# Patient Record
Sex: Male | Born: 1940 | Race: White | Hispanic: No | Marital: Married | Smoking: Never smoker
Health system: Southern US, Community
[De-identification: ages and names within clinical notes are randomized; demographics above are authoritative.]

## PROBLEM LIST (undated history)

## (undated) DIAGNOSIS — M1990 Unspecified osteoarthritis, unspecified site: Secondary | ICD-10-CM

## (undated) DIAGNOSIS — C61 Malignant neoplasm of prostate: Secondary | ICD-10-CM

## (undated) DIAGNOSIS — E785 Hyperlipidemia, unspecified: Secondary | ICD-10-CM

## (undated) DIAGNOSIS — R972 Elevated prostate specific antigen [PSA]: Secondary | ICD-10-CM

## (undated) DIAGNOSIS — I2510 Atherosclerotic heart disease of native coronary artery without angina pectoris: Secondary | ICD-10-CM

## (undated) DIAGNOSIS — IMO0002 Reserved for concepts with insufficient information to code with codable children: Secondary | ICD-10-CM

## (undated) DIAGNOSIS — M109 Gout, unspecified: Secondary | ICD-10-CM

## (undated) DIAGNOSIS — D126 Benign neoplasm of colon, unspecified: Secondary | ICD-10-CM

## (undated) DIAGNOSIS — N182 Chronic kidney disease, stage 2 (mild): Secondary | ICD-10-CM

## (undated) DIAGNOSIS — K4090 Unilateral inguinal hernia, without obstruction or gangrene, not specified as recurrent: Secondary | ICD-10-CM

## (undated) DIAGNOSIS — B029 Zoster without complications: Secondary | ICD-10-CM

## (undated) DIAGNOSIS — I10 Essential (primary) hypertension: Secondary | ICD-10-CM

## (undated) DIAGNOSIS — Z5189 Encounter for other specified aftercare: Secondary | ICD-10-CM

## (undated) DIAGNOSIS — R339 Retention of urine, unspecified: Secondary | ICD-10-CM

## (undated) DIAGNOSIS — N400 Enlarged prostate without lower urinary tract symptoms: Secondary | ICD-10-CM

## (undated) DIAGNOSIS — C44319 Basal cell carcinoma of skin of other parts of face: Secondary | ICD-10-CM

## (undated) DIAGNOSIS — H43813 Vitreous degeneration, bilateral: Secondary | ICD-10-CM

## (undated) HISTORY — PX: LUMBAR LAMINECTOMY: SHX95

## (undated) HISTORY — PX: HERNIA REPAIR: SHX51

## (undated) HISTORY — PX: SPINE SURGERY: SHX786

## (undated) HISTORY — DX: Atherosclerotic heart disease of native coronary artery without angina pectoris: I25.10

## (undated) HISTORY — DX: Hyperlipidemia, unspecified: E78.5

## (undated) HISTORY — PX: POLYPECTOMY: SHX149

## (undated) HISTORY — PX: OTHER SURGICAL HISTORY: SHX169

## (undated) HISTORY — DX: Benign neoplasm of colon, unspecified: D12.6

## (undated) HISTORY — PX: COLONOSCOPY: SHX174

## (undated) HISTORY — DX: Retention of urine, unspecified: R33.9

## (undated) HISTORY — DX: Elevated prostate specific antigen (PSA): R97.20

## (undated) HISTORY — DX: Chronic kidney disease, stage 2 (mild): N18.2

## (undated) HISTORY — DX: Essential (primary) hypertension: I10

## (undated) HISTORY — DX: Reserved for concepts with insufficient information to code with codable children: IMO0002

## (undated) HISTORY — PX: BIOPSY PROSTATE: PRO28

## (undated) HISTORY — PX: TONSILLECTOMY: SUR1361

## (undated) HISTORY — DX: Benign prostatic hyperplasia without lower urinary tract symptoms: N40.0

## (undated) HISTORY — DX: Zoster without complications: B02.9

## (undated) HISTORY — DX: Gout, unspecified: M10.9

## (undated) HISTORY — PX: CARDIOVASCULAR STRESS TEST: SHX262

## (undated) HISTORY — DX: Encounter for other specified aftercare: Z51.89

## (undated) HISTORY — PX: FLEXIBLE SIGMOIDOSCOPY: SHX1649

## (undated) HISTORY — DX: Unilateral inguinal hernia, without obstruction or gangrene, not specified as recurrent: K40.90

## (undated) HISTORY — DX: Malignant neoplasm of prostate: C61

## (undated) HISTORY — DX: Vitreous degeneration, bilateral: H43.813

## (undated) HISTORY — PX: HEMORRHOID SURGERY: SHX153

---

## 1979-11-04 DIAGNOSIS — IMO0002 Reserved for concepts with insufficient information to code with codable children: Secondary | ICD-10-CM

## 1979-11-04 HISTORY — DX: Reserved for concepts with insufficient information to code with codable children: IMO0002

## 1999-09-19 ENCOUNTER — Ambulatory Visit (HOSPITAL_BASED_OUTPATIENT_CLINIC_OR_DEPARTMENT_OTHER): Admission: RE | Admit: 1999-09-19 | Discharge: 1999-09-19 | Payer: Self-pay | Admitting: Surgery

## 1999-09-19 ENCOUNTER — Encounter (INDEPENDENT_AMBULATORY_CARE_PROVIDER_SITE_OTHER): Payer: Self-pay | Admitting: *Deleted

## 2000-02-17 ENCOUNTER — Ambulatory Visit (HOSPITAL_COMMUNITY): Admission: RE | Admit: 2000-02-17 | Discharge: 2000-02-17 | Payer: Self-pay | Admitting: Neurosurgery

## 2000-02-17 ENCOUNTER — Encounter: Payer: Self-pay | Admitting: Neurosurgery

## 2001-11-03 DIAGNOSIS — Z5189 Encounter for other specified aftercare: Secondary | ICD-10-CM

## 2001-11-03 DIAGNOSIS — IMO0001 Reserved for inherently not codable concepts without codable children: Secondary | ICD-10-CM

## 2001-11-03 DIAGNOSIS — I2510 Atherosclerotic heart disease of native coronary artery without angina pectoris: Secondary | ICD-10-CM

## 2001-11-03 HISTORY — PX: CORONARY ARTERY BYPASS GRAFT: SHX141

## 2001-11-03 HISTORY — DX: Atherosclerotic heart disease of native coronary artery without angina pectoris: I25.10

## 2001-11-03 HISTORY — DX: Reserved for inherently not codable concepts without codable children: IMO0001

## 2001-11-03 HISTORY — DX: Encounter for other specified aftercare: Z51.89

## 2001-11-10 ENCOUNTER — Inpatient Hospital Stay (HOSPITAL_COMMUNITY): Admission: EM | Admit: 2001-11-10 | Discharge: 2001-11-12 | Payer: Self-pay | Admitting: Emergency Medicine

## 2001-11-11 ENCOUNTER — Encounter: Payer: Self-pay | Admitting: *Deleted

## 2001-11-17 ENCOUNTER — Encounter: Payer: Self-pay | Admitting: Surgery

## 2001-11-17 ENCOUNTER — Inpatient Hospital Stay (HOSPITAL_COMMUNITY): Admission: RE | Admit: 2001-11-17 | Discharge: 2001-11-23 | Payer: Self-pay | Admitting: Surgery

## 2001-11-18 ENCOUNTER — Encounter: Payer: Self-pay | Admitting: Surgery

## 2001-11-19 ENCOUNTER — Encounter: Payer: Self-pay | Admitting: Surgery

## 2002-12-23 ENCOUNTER — Ambulatory Visit (HOSPITAL_BASED_OUTPATIENT_CLINIC_OR_DEPARTMENT_OTHER): Admission: RE | Admit: 2002-12-23 | Discharge: 2002-12-23 | Payer: Self-pay | Admitting: Surgery

## 2003-11-04 DIAGNOSIS — D126 Benign neoplasm of colon, unspecified: Secondary | ICD-10-CM

## 2003-11-04 HISTORY — DX: Benign neoplasm of colon, unspecified: D12.6

## 2005-02-11 ENCOUNTER — Ambulatory Visit: Payer: Self-pay | Admitting: *Deleted

## 2005-02-27 ENCOUNTER — Ambulatory Visit: Payer: Self-pay | Admitting: *Deleted

## 2005-04-07 ENCOUNTER — Ambulatory Visit: Payer: Self-pay | Admitting: Internal Medicine

## 2005-07-08 ENCOUNTER — Ambulatory Visit: Payer: Self-pay | Admitting: *Deleted

## 2005-07-10 ENCOUNTER — Ambulatory Visit: Payer: Self-pay

## 2005-07-15 ENCOUNTER — Ambulatory Visit: Payer: Self-pay | Admitting: *Deleted

## 2005-08-27 ENCOUNTER — Ambulatory Visit: Payer: Self-pay | Admitting: Internal Medicine

## 2006-03-03 ENCOUNTER — Ambulatory Visit: Payer: Self-pay | Admitting: *Deleted

## 2006-03-16 ENCOUNTER — Ambulatory Visit: Payer: Self-pay | Admitting: *Deleted

## 2006-06-30 ENCOUNTER — Ambulatory Visit: Payer: Self-pay | Admitting: *Deleted

## 2006-07-09 ENCOUNTER — Ambulatory Visit: Payer: Self-pay | Admitting: *Deleted

## 2006-08-31 ENCOUNTER — Ambulatory Visit: Payer: Self-pay | Admitting: Internal Medicine

## 2006-09-15 ENCOUNTER — Ambulatory Visit: Payer: Self-pay | Admitting: Internal Medicine

## 2006-11-26 ENCOUNTER — Ambulatory Visit: Payer: Self-pay | Admitting: Internal Medicine

## 2006-11-26 ENCOUNTER — Ambulatory Visit: Payer: Self-pay | Admitting: Gastroenterology

## 2006-11-26 LAB — CONVERTED CEMR LAB
BUN: 20 mg/dL (ref 6–23)
Creatinine, Ser: 1 mg/dL (ref 0.4–1.5)
Potassium: 4.8 meq/L (ref 3.5–5.1)

## 2006-11-30 ENCOUNTER — Ambulatory Visit: Payer: Self-pay | Admitting: Internal Medicine

## 2006-12-24 ENCOUNTER — Ambulatory Visit: Payer: Self-pay | Admitting: Internal Medicine

## 2006-12-28 ENCOUNTER — Ambulatory Visit: Payer: Self-pay | Admitting: Gastroenterology

## 2007-01-08 ENCOUNTER — Ambulatory Visit: Payer: Self-pay | Admitting: *Deleted

## 2007-02-23 ENCOUNTER — Ambulatory Visit: Payer: Self-pay | Admitting: Internal Medicine

## 2007-04-28 ENCOUNTER — Ambulatory Visit: Payer: Self-pay | Admitting: Internal Medicine

## 2007-06-04 ENCOUNTER — Ambulatory Visit: Payer: Self-pay | Admitting: Cardiovascular Disease

## 2007-06-04 ENCOUNTER — Ambulatory Visit: Payer: Self-pay

## 2007-06-04 ENCOUNTER — Encounter: Payer: Self-pay | Admitting: Internal Medicine

## 2007-06-04 LAB — CONVERTED CEMR LAB
ALT: 24 units/L (ref 0–53)
AST: 22 units/L (ref 0–37)
Albumin: 4 g/dL (ref 3.5–5.2)
Alkaline Phosphatase: 49 units/L (ref 39–117)
BUN: 22 mg/dL (ref 6–23)
Bilirubin, Direct: 0.1 mg/dL (ref 0.0–0.3)
CO2: 30 meq/L (ref 19–32)
Calcium: 9.2 mg/dL (ref 8.4–10.5)
Chloride: 108 meq/L (ref 96–112)
Cholesterol: 96 mg/dL (ref 0–200)
Creatinine, Ser: 1.1 mg/dL (ref 0.4–1.5)
GFR calc Af Amer: 86 mL/min
GFR calc non Af Amer: 71 mL/min
Glucose, Bld: 108 mg/dL — ABNORMAL HIGH (ref 70–99)
HDL: 29.9 mg/dL — ABNORMAL LOW (ref >39.0)
LDL Cholesterol: 42 mg/dL (ref 0–99)
Potassium: 4.3 meq/L (ref 3.5–5.1)
Sodium: 144 meq/L (ref 135–145)
Total Bilirubin: 0.9 mg/dL (ref 0.3–1.2)
Total CHOL/HDL Ratio: 3.2
Total Protein: 6.5 g/dL (ref 6.0–8.3)
Triglycerides: 119 mg/dL (ref 0–149)
VLDL: 24 mg/dL (ref 0–40)

## 2007-06-10 ENCOUNTER — Ambulatory Visit: Payer: Self-pay | Admitting: Cardiovascular Disease

## 2007-06-15 DIAGNOSIS — K219 Gastro-esophageal reflux disease without esophagitis: Secondary | ICD-10-CM

## 2007-06-15 DIAGNOSIS — K279 Peptic ulcer, site unspecified, unspecified as acute or chronic, without hemorrhage or perforation: Secondary | ICD-10-CM | POA: Insufficient documentation

## 2007-06-15 DIAGNOSIS — K649 Unspecified hemorrhoids: Secondary | ICD-10-CM | POA: Insufficient documentation

## 2007-06-15 DIAGNOSIS — B351 Tinea unguium: Secondary | ICD-10-CM

## 2007-06-17 ENCOUNTER — Ambulatory Visit: Payer: Self-pay | Admitting: Internal Medicine

## 2007-07-08 IMAGING — CT CT CHEST W/ CM
2 of 4 series · 15 of 36 positions shown, 18 images · IV contrast (omnipaque)
Comparison: None.

CLINICAL DATA: Chronic cough.  Shortness of breath.  Coronary artery disease and coronary bypass grafting. 
 CHEST CT WITH CONTRAST:
TECHNIQUE: Multidetector CT imaging of the chest was performed following the standard protocol during bolus administration of intravenous contrast.
 Contrast:  80 cc Omnipaque 300

[Series 2: chest_routine 5.0 b40f st · axial · 0.75mm/px · z∈[-370,-80]mm · 12 of 70 slices shown, 15 images]
[im 6/70  mediastinal]
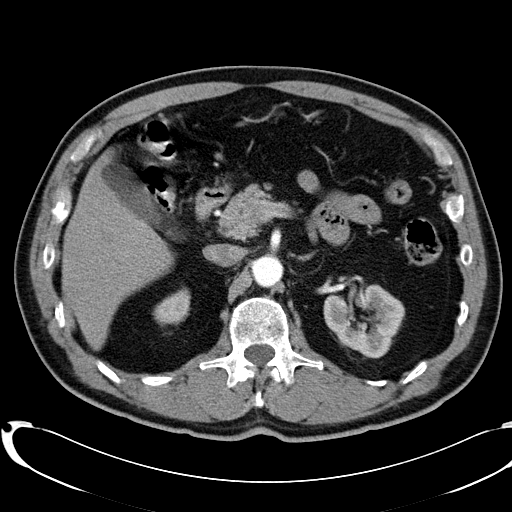
[im 6/70  lung]
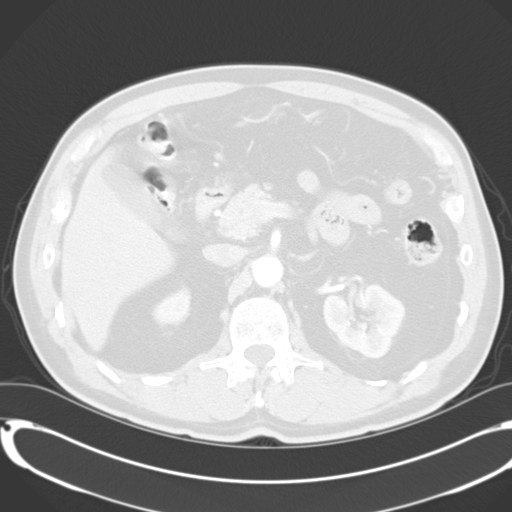
[im 11/70  lung]
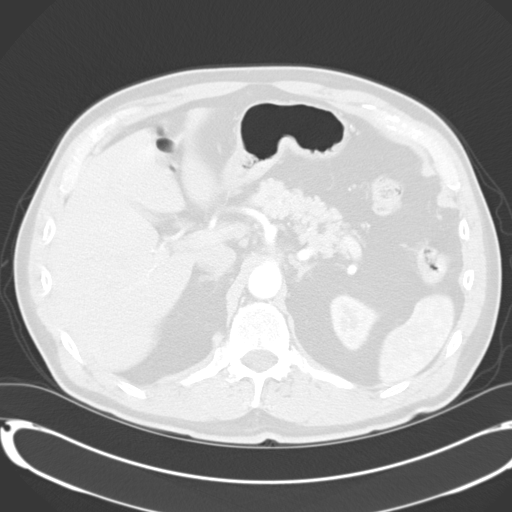
[im 16/70  lung]
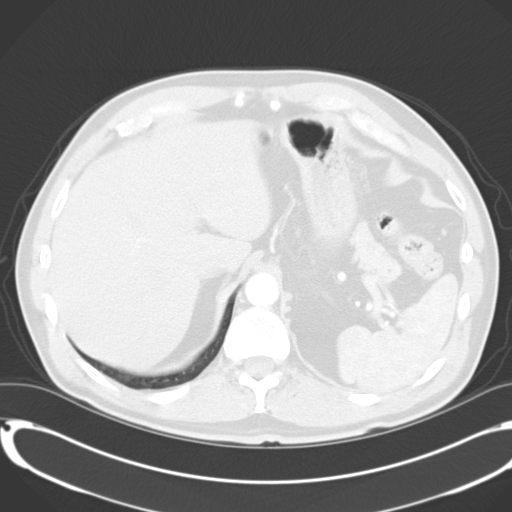
[im 22/70  lung]
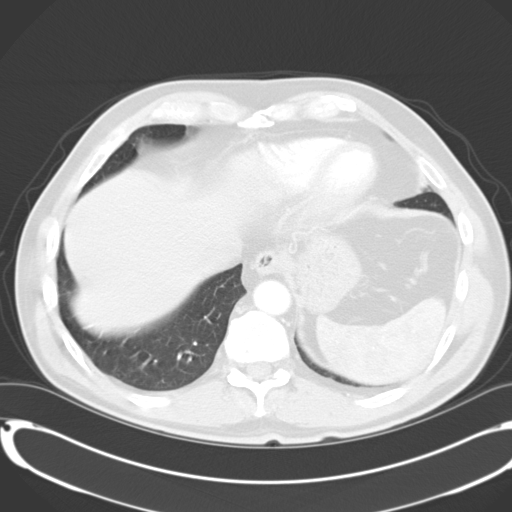
[im 27/70  mediastinal]
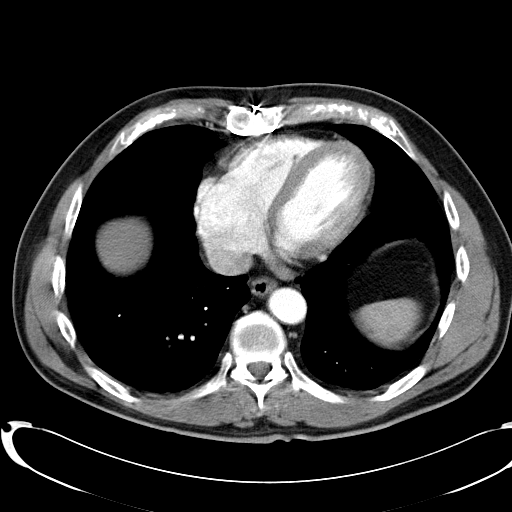
[im 27/70  lung]
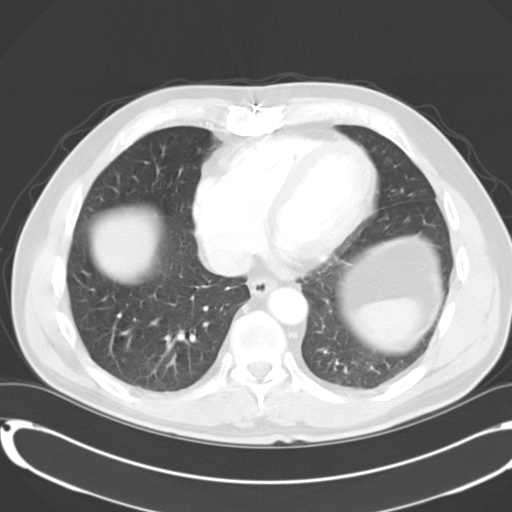
[im 32/70  lung]
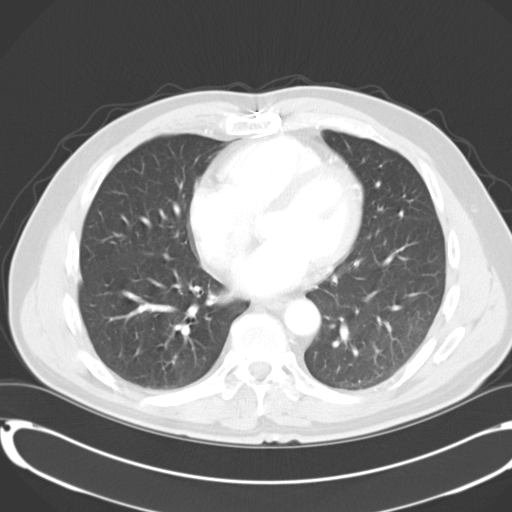
[im 38/70  lung]
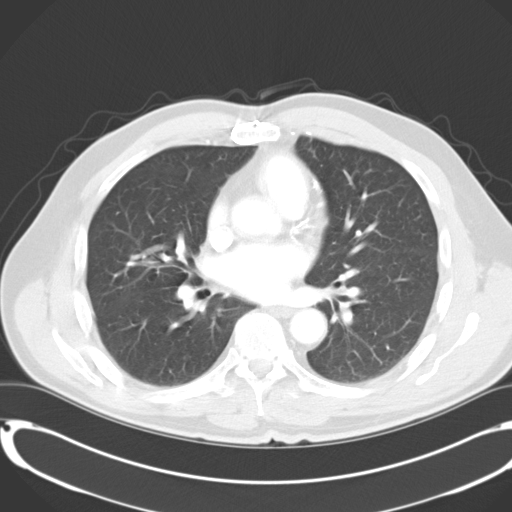
[im 43/70  lung]
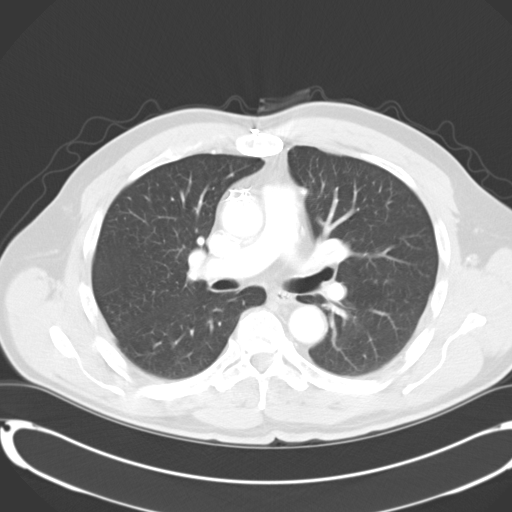
[im 48/70  mediastinal]
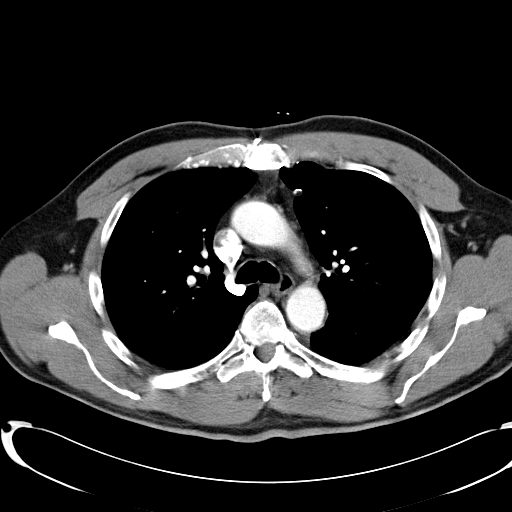
[im 48/70  lung]
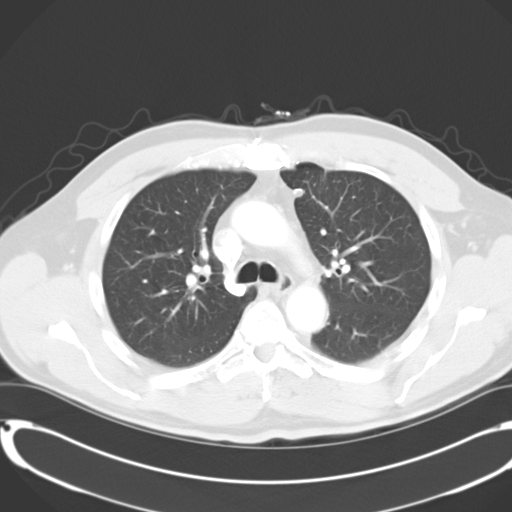
[im 54/70  lung]
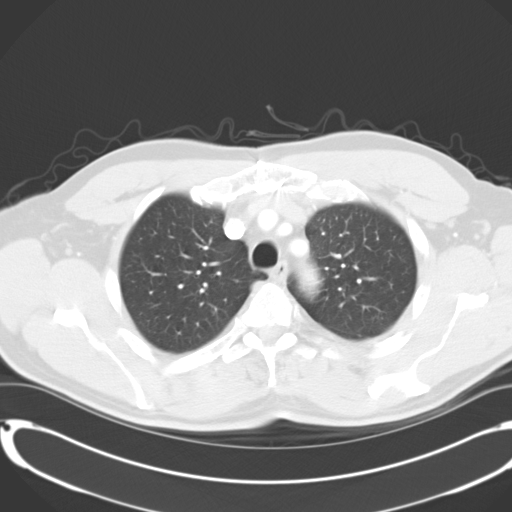
[im 59/70  lung]
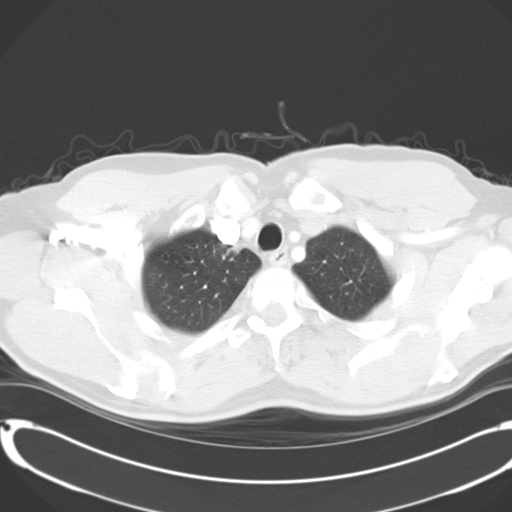
[im 64/70  lung]
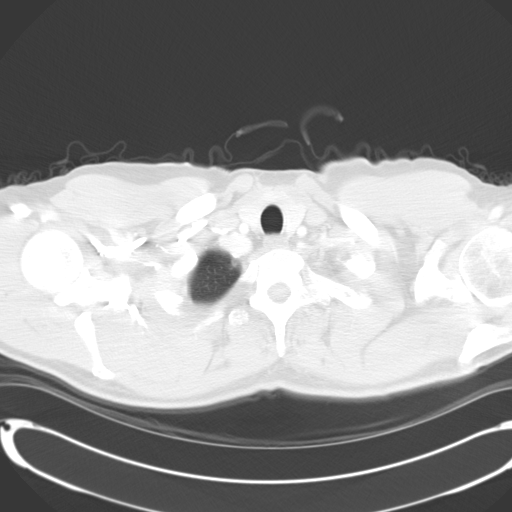

[Series 602: <mpr range> · coronal · 0.75mm/px · 3 of 92 slices shown]
[im 19/92  lung]
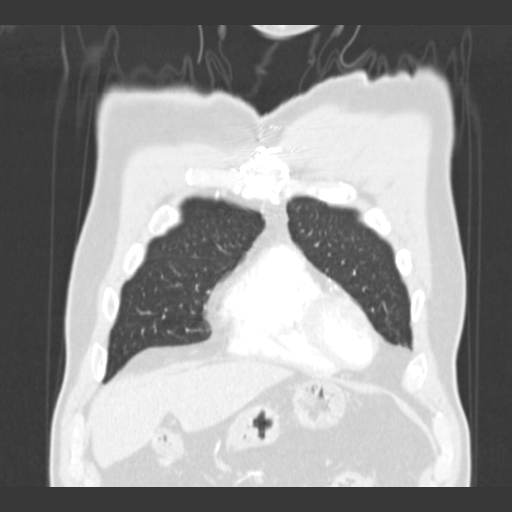
[im 37/92  lung]
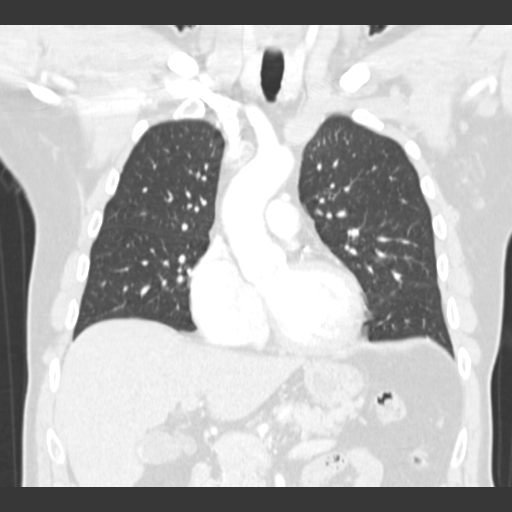
[im 55/92  lung]
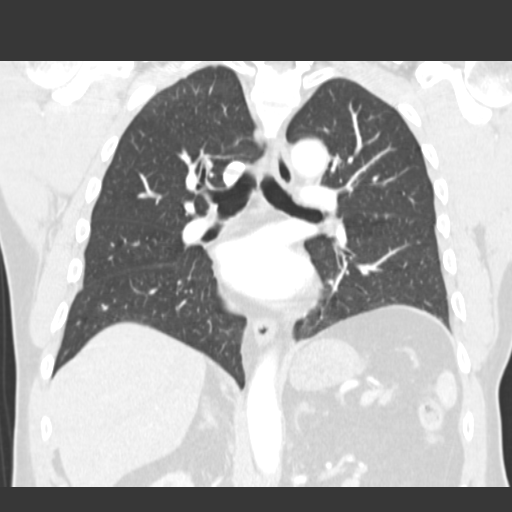

[15 of 36 positions shown; findings below may reference images not displayed]

FINDINGS: Postop changes are seen from prior coronary artery bypass grafting.  There is no evidence of mass or adenopathy within the thorax.  There is no evidence of pleural or pericardial effusion.  There is no evidence of pulmonary infiltrate.  Several tiny nodular densities are seen in both lungs which are indeterminate but are likely benign in patient without history of malignancies.  
 Images obtained through the upper abdomen show the presence of multiple small calcified gallstones.
IMPRESSION: 1.  No acute findings. 
 2.  Tiny indeterminate bilateral pulmonary nodules.  These are likely benign in patient without history of malignancy.  Clinical correlation is recommended, and followup CT is suggested in six months.  
 3.  Cholelithiasis incidentally noted.

## 2007-08-09 ENCOUNTER — Encounter: Payer: Self-pay | Admitting: Internal Medicine

## 2007-08-17 ENCOUNTER — Ambulatory Visit: Payer: Self-pay | Admitting: Internal Medicine

## 2007-08-30 ENCOUNTER — Ambulatory Visit: Payer: Self-pay | Admitting: Internal Medicine

## 2007-08-30 DIAGNOSIS — E782 Mixed hyperlipidemia: Secondary | ICD-10-CM | POA: Insufficient documentation

## 2007-08-30 DIAGNOSIS — N400 Enlarged prostate without lower urinary tract symptoms: Secondary | ICD-10-CM | POA: Insufficient documentation

## 2007-08-30 DIAGNOSIS — I10 Essential (primary) hypertension: Secondary | ICD-10-CM

## 2007-08-30 LAB — CONVERTED CEMR LAB
Bilirubin Urine: NEGATIVE
Blood in Urine, dipstick: NEGATIVE
Glucose, Urine, Semiquant: NEGATIVE
Ketones, urine, test strip: NEGATIVE
Nitrite: NEGATIVE
Protein, U semiquant: NEGATIVE
Specific Gravity, Urine: 1.025
Urobilinogen, UA: NEGATIVE
WBC Urine, dipstick: NEGATIVE
pH: 5

## 2007-12-31 ENCOUNTER — Ambulatory Visit: Payer: Self-pay | Admitting: Internal Medicine

## 2008-03-02 ENCOUNTER — Encounter: Payer: Self-pay | Admitting: Internal Medicine

## 2008-03-22 ENCOUNTER — Telehealth: Payer: Self-pay | Admitting: Internal Medicine

## 2008-03-22 ENCOUNTER — Encounter: Payer: Self-pay | Admitting: Internal Medicine

## 2008-03-22 ENCOUNTER — Telehealth (INDEPENDENT_AMBULATORY_CARE_PROVIDER_SITE_OTHER): Payer: Self-pay | Admitting: *Deleted

## 2008-06-09 ENCOUNTER — Ambulatory Visit: Payer: Self-pay | Admitting: Cardiovascular Disease

## 2008-06-09 LAB — CONVERTED CEMR LAB
ALT: 19 units/L (ref 0–53)
AST: 20 units/L (ref 0–37)
Albumin: 3.9 g/dL (ref 3.5–5.2)
Alkaline Phosphatase: 46 units/L (ref 39–117)
Bilirubin, Direct: 0.1 mg/dL (ref 0.0–0.3)
Cholesterol: 91 mg/dL (ref 0–200)
Glucose, Bld: 110 mg/dL — ABNORMAL HIGH (ref 70–99)
HDL: 29.4 mg/dL — ABNORMAL LOW (ref >39.0)
LDL Cholesterol: 48 mg/dL (ref 0–99)
Total Bilirubin: 0.8 mg/dL (ref 0.3–1.2)
Total CHOL/HDL Ratio: 3.1
Total Protein: 6.5 g/dL (ref 6.0–8.3)
Triglycerides: 66 mg/dL (ref 0–149)
VLDL: 13 mg/dL (ref 0–40)

## 2008-06-16 ENCOUNTER — Ambulatory Visit: Payer: Self-pay | Admitting: Cardiovascular Disease

## 2008-07-27 ENCOUNTER — Encounter: Payer: Self-pay | Admitting: Internal Medicine

## 2008-08-03 ENCOUNTER — Ambulatory Visit: Payer: Self-pay | Admitting: Internal Medicine

## 2008-08-16 ENCOUNTER — Encounter: Payer: Self-pay | Admitting: Internal Medicine

## 2008-08-28 ENCOUNTER — Ambulatory Visit: Payer: Self-pay | Admitting: Internal Medicine

## 2008-08-28 LAB — CONVERTED CEMR LAB
Bilirubin Urine: NEGATIVE
Ketones, urine, test strip: NEGATIVE
Specific Gravity, Urine: 1.025
pH: 6

## 2008-08-31 ENCOUNTER — Telehealth (INDEPENDENT_AMBULATORY_CARE_PROVIDER_SITE_OTHER): Payer: Self-pay | Admitting: *Deleted

## 2009-05-03 LAB — CONVERTED CEMR LAB: PSA: 2.65 ng/mL

## 2009-06-14 ENCOUNTER — Telehealth (INDEPENDENT_AMBULATORY_CARE_PROVIDER_SITE_OTHER): Payer: Self-pay | Admitting: *Deleted

## 2009-06-15 DIAGNOSIS — I25810 Atherosclerosis of coronary artery bypass graft(s) without angina pectoris: Secondary | ICD-10-CM | POA: Insufficient documentation

## 2009-06-18 ENCOUNTER — Ambulatory Visit: Payer: Self-pay

## 2009-06-18 ENCOUNTER — Encounter: Payer: Self-pay | Admitting: Cardiology

## 2009-06-18 ENCOUNTER — Ambulatory Visit: Payer: Self-pay | Admitting: Cardiovascular Disease

## 2009-06-21 ENCOUNTER — Ambulatory Visit: Payer: Self-pay | Admitting: Cardiovascular Disease

## 2009-06-22 ENCOUNTER — Ambulatory Visit: Payer: Self-pay

## 2009-06-22 ENCOUNTER — Encounter: Payer: Self-pay | Admitting: Cardiovascular Disease

## 2009-06-24 LAB — CONVERTED CEMR LAB
BUN: 23 mg/dL (ref 6–23)
Bilirubin, Direct: 0.1 mg/dL (ref 0.0–0.3)
Chloride: 106 meq/L (ref 96–112)
Glucose, Bld: 91 mg/dL (ref 70–99)
Potassium: 4.2 meq/L (ref 3.5–5.1)
Total Bilirubin: 1 mg/dL (ref 0.3–1.2)

## 2009-06-25 LAB — CONVERTED CEMR LAB
LDL Cholesterol: 49 mg/dL (ref 0–99)
VLDL: 20.4 mg/dL (ref 0.0–40.0)

## 2009-08-01 ENCOUNTER — Encounter: Payer: Self-pay | Admitting: Internal Medicine

## 2009-08-02 ENCOUNTER — Ambulatory Visit: Payer: Self-pay | Admitting: Internal Medicine

## 2009-09-03 ENCOUNTER — Ambulatory Visit: Payer: Self-pay | Admitting: Internal Medicine

## 2009-09-03 LAB — CONVERTED CEMR LAB
Glucose, Urine, Semiquant: NEGATIVE
Nitrite: NEGATIVE
Protein, U semiquant: NEGATIVE
Specific Gravity, Urine: 1.015
WBC Urine, dipstick: NEGATIVE
pH: 6

## 2009-11-20 ENCOUNTER — Encounter: Payer: Self-pay | Admitting: Internal Medicine

## 2009-11-24 ENCOUNTER — Ambulatory Visit: Payer: Self-pay | Admitting: Family Medicine

## 2009-12-24 ENCOUNTER — Ambulatory Visit: Payer: Self-pay | Admitting: Internal Medicine

## 2009-12-24 DIAGNOSIS — J0110 Acute frontal sinusitis, unspecified: Secondary | ICD-10-CM | POA: Insufficient documentation

## 2010-04-04 ENCOUNTER — Encounter: Payer: Self-pay | Admitting: Internal Medicine

## 2010-07-04 ENCOUNTER — Ambulatory Visit: Payer: Self-pay | Admitting: Cardiovascular Disease

## 2010-07-04 ENCOUNTER — Telehealth: Payer: Self-pay | Admitting: Cardiovascular Disease

## 2010-07-15 LAB — CONVERTED CEMR LAB
AST: 23 units/L (ref 0–37)
Albumin: 4.1 g/dL (ref 3.5–5.2)
Alkaline Phosphatase: 42 units/L (ref 39–117)
BUN: 22 mg/dL (ref 6–23)
Bilirubin, Direct: 0.2 mg/dL (ref 0.0–0.3)
CO2: 29 meq/L (ref 19–32)
Calcium: 9.2 mg/dL (ref 8.4–10.5)
Creatinine, Ser: 1.1 mg/dL (ref 0.4–1.5)
Total Bilirubin: 0.6 mg/dL (ref 0.3–1.2)
Total CHOL/HDL Ratio: 3
Triglycerides: 54 mg/dL (ref 0.0–149.0)

## 2010-07-17 ENCOUNTER — Ambulatory Visit: Payer: Self-pay | Admitting: Internal Medicine

## 2010-09-20 ENCOUNTER — Encounter: Payer: Self-pay | Admitting: Internal Medicine

## 2010-09-23 ENCOUNTER — Ambulatory Visit: Payer: Self-pay | Admitting: Internal Medicine

## 2010-09-23 LAB — CONVERTED CEMR LAB
Bilirubin Urine: NEGATIVE
Blood in Urine, dipstick: NEGATIVE
Ketones, urine, test strip: NEGATIVE
Nitrite: NEGATIVE
Protein, U semiquant: NEGATIVE
Urobilinogen, UA: 0.2

## 2010-11-26 ENCOUNTER — Telehealth: Payer: Self-pay | Admitting: Internal Medicine

## 2010-11-27 ENCOUNTER — Ambulatory Visit: Admit: 2010-11-27 | Payer: Self-pay | Admitting: Internal Medicine

## 2010-12-03 NOTE — Letter (Signed)
Summary: Medical Certificate Second Class/FAA  Medical Certificate Second Class/FAA   Imported By: Lanelle Bal 10/05/2010 11:56:19  _____________________________________________________________________  External Attachment:    Type:   Image     Comment:   External Document

## 2010-12-03 NOTE — Letter (Signed)
Summary: Letter Regarding Airman Medical Certificate/FAA  Letter Regarding [employer]man Medical Certificate/FAA   Imported By: Lanelle Bal 11/30/2009 12:49:32  _____________________________________________________________________  External Attachment:    Type:   Image     Comment:   External Document

## 2010-12-03 NOTE — Assessment & Plan Note (Signed)
Summary: cough//ccm   Vital Signs:  Patient profile:   70 year old male Temp:     99.8 degrees F oral Pulse rate:   76 / minute Resp:     14 per minute BP sitting:   130 / 80  (left arm)  Vitals Entered By: Willy Eddy, LPN (December 24, 2009 4:01 PM) CC: started 5 weeks ago when around mother in law who had penumonia- took zpack which helped- sick again to urgent care and was neg for flu but was given tamiflu and improved then got sick agian last week with cough and congestion and fever of 99.8-100.5   Primary Care Provider:  Stacie Glaze MD  CC:  started 5 weeks ago when around mother in law who had penumonia- took zpack which helped- sick again to urgent care and was neg for flu but was given tamiflu and improved then got sick agian last week with cough and congestion and fever of 99.8-100.5.  History of Present Illness: 5 weeks ago had a "URI" took a zpack from neighbor the pt was seen seveval weeks ago and was tested fro flu he tool tamiflu and got better then he stated to cough ( productive) and chills with low grade fevel last weekend into today extreme fatique  Preventive Screening-Counseling & Management  Alcohol-Tobacco     Smoking Status: never  Current Medications (verified): 1)  Adult Aspirin Low Strength 81 Mg  Tbdp (Aspirin) .... Once Daily 2)  Avodart 0.5 Mg  Caps (Dutasteride) .... Once Daily 3)  Crestor 10 Mg  Tabs (Rosuvastatin Calcium) .... Once Daily 4)  Omega-3 1000 Mg  Caps (Omega-3 Fatty Acids) .... Once Daily 5)  Atenolol 25 Mg  Tabs (Atenolol) .[name] Kitchen.. 1 By Mouth Once Daily 6)  Eye Vitamins   Caps (Multiple Vitamins-Minerals) .... Once Daily 7)  Azor 10-40 Mg  Tabs (Amlodipine-Olmesartan) .... 1/2 Two Times A Day 8)  Zithromax Z-Pak 250 Mg Tabs (Azithromycin)  Allergies (verified): 1)  ! Pcn  Past History:  Family History: Last updated: 06/15/2009 Family History Psychiatric care F & bro MI ; M cns aneurysm, pacer  His father died at age  72 of a suicide after having a myocardial infarction.  His brother is 20 and had stents placed.  Social History: Last updated: 06/15/2009 Never Smoked Retired; part Chartered certified accountant Married with 2 children no diet Alcohol use-no  Risk Factors: Exercise: yes (08/30/2007)  Risk Factors: Smoking Status: never (12/24/2009)  Past medical, surgical, family and social histories (including risk factors) reviewed, and no changes noted (except as noted below).  Past Medical History: Reviewed history from 09/03/2009 and no changes required. Fungal Toenail Infection, toenail removed Hyperlipidemia Hypertension Coronary artery disease ,S/P CABG Peptic ulcer disease, PMH of; H. pylori gastritis 1990  Past Surgical History: Reviewed history from 09/03/2009 and no changes required. S/P Coronary artery bypass graft 2003 Ganglion cystectomy - hand Colonoscopy neg 2008; polyp 2005 Flexible Sigmoidoscopy - 1992, 1997 Inguinal herniorrhaphy Lumbar laminectomy @ L4-5 Prostate biopsy negative, Dr Logan Bores Hemorrhoidectomy  Family History: Reviewed history from 06/15/2009 and no changes required. Family History Psychiatric care F & bro MI ; M cns aneurysm, pacer  His father died at age 48 of a suicide after having a myocardial infarction.  His brother is 37 and had stents placed.  Social History: Reviewed history from 06/15/2009 and no changes required. 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  The patient denies anorexia, weight gain, vision loss, decreased hearing, hoarseness, chest pain, syncope, dyspnea on exertion, peripheral edema, hemoptysis, abdominal pain, melena, hematochezia, severe indigestion/heartburn, hematuria, incontinence, genital sores, muscle weakness, suspicious skin lesions, transient blindness, difficulty  walking, depression, unusual weight change, abnormal bleeding, enlarged lymph nodes, angioedema, breast masses, and testicular masses.         FATIGUE  Physical Exam  General:  Well-developed,well-nourished,in no acute distress; alert,appropriate and cooperative throughout examination Head:  Normocephalic and atraumatic without obvious abnormalities. No apparent alopecia  Eyes:  pupils equal and pupils round.   Ears:  R ear normal and L ear normal.   Nose:  L maxillary sinus tenderness.   Mouth:  posterior lymphoid hypertrophy and postnasal drip.   Neck:  No deformities, masses, or tenderness noted. Lungs:  normal respiratory effort and no wheezes.   Heart:  normal rate and regular rhythm.   Abdomen:  Bowel sounds positive,abdomen soft and non-tender without masses, organomegaly or hernias noted.   Impression & Recommendations:  Problem # 1:  ACUTE FRONTAL SINUSITIS (ICD-461.1) Assessment New I have spent greater that 30 min face to face evaluating this patient of which 1/2 was councilling The following medications were removed from the medication list:    Zithromax Z-pak 250 Mg Tabs (Azithromycin) His updated medication list for this problem includes:    Sulfamethoxazole-tmp Ds 800-160 Mg Tabs (Sulfamethoxazole-trimethoprim) ..... One by mouth two times a day    Respivent-d 120-2.5 Mg Xr12h-tab (Pseudoephedrine-methscopolamin) ..... One po two times a day    Tussicaps 10-8 Mg Xr12h-cap (Hydrocod polst-chlorphen polst) ..... One by mouth two times a day as needed  Instructed on treatment. Call if symptoms persist or worsen.   Complete Medication List: 1)  Adult Aspirin Low Strength 81 Mg Tbdp (Aspirin) .... Once daily 2)  Avodart 0.5 Mg Caps (Dutasteride) .... Once daily 3)  Crestor 10 Mg Tabs (Rosuvastatin calcium) .... Once daily 4)  Omega-3 1000 Mg Caps (Omega-3 fatty acids) .... Once daily 5)  Atenolol 25 Mg Tabs (Atenolol) .[name] Kitchen.. 1 by mouth once daily 6)  Eye Vitamins Caps  (Multiple vitamins-minerals) .... Once daily 7)  Azor 10-40 Mg Tabs (Amlodipine-olmesartan) .... 1/2 two times a day 8)  Sulfamethoxazole-tmp Ds 800-160 Mg Tabs (Sulfamethoxazole-trimethoprim) .... One by mouth two times a day 9)  Respivent-d 120-2.5 Mg Xr12h-tab (Pseudoephedrine-methscopolamin) .... One po two times a day 10)  Tussicaps 10-8 Mg Xr12h-cap (Hydrocod polst-chlorphen polst) .... One by mouth two times a day as needed  Patient Instructions: 1)  Take your antibiotic as prescribed until ALL of it is gone, but stop if you develop a rash or swelling and contact our office as soon as possible. Prescriptions: TUSSICAPS 10-8 MG XR12H-CAP (HYDROCOD POLST-CHLORPHEN POLST) ONE by mouth two times a day AS NEEDED  #20 x 0   Entered and Authorized by:   Stacie Glaze MD   Signed by:   Stacie Glaze MD on 12/24/2009   Method used:   Print then Give to Patient   RxID:   [id number] RESPIVENT-D 120-2.5 MG XR12H-TAB (PSEUDOEPHEDRINE-METHSCOPOLAMIN) one po two times a day  #20 x 0   Entered and Authorized by:   Stacie Glaze MD   Signed by:   Stacie Glaze MD on 12/24/2009   Method used:   Electronically to        [facility]* (retail)       [address]       [place], [place]  [address]       Ph: [phone] or [phone]       Fax: [phone]   RxID:   [id number] SULFAMETHOXAZOLE-TMP DS 800-160 MG TABS (SULFAMETHOXAZOLE-TRIMETHOPRIM) one by mouth two times a day  #28 x 0   Entered and Authorized by:   Stacie Glaze MD   Signed by:   Stacie Glaze MD on 12/24/2009   Method used:   Electronically to        [facility]* (retail)       [address]       [place], [place]  [address]       Ph: [phone] or [phone]       Fax: [phone]   RxID:   [id number]

## 2010-12-03 NOTE — Assessment & Plan Note (Signed)
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   Vital Signs:  Patient profile:   70 year old male Height:      72.25 inches Weight:      188.4 pounds BMI:     25.47 Temp:     97.7 degrees F oral Pulse rate:   56 / minute Resp:     14 per minute BP sitting:   118 / 70  (left arm) Cuff size:   large  Vitals Entered By: Shonna Chock CMA (September 23, 2010 11:07 AM) CC: Flight CPX-2nd class  Comments Hearing: Right:  500/15  1000/10  2000/10  3000/10  4000/15 Left:    500/15  1000/10  2000/15  3000/30  4000/15  Vision Screening:Left eye w/o correction: 20 / 200 Right Eye w/o correction: 20 / 200 Both eyes w/o correction:  20/ 200 Left eye with correction: 20 / 20 Right eye with correction: 20 / 20 Both eyes with correction: 20 / 20  Color vision testing: normal     Vision Comments: Near Vision  With: Left:20/25  Right: 20/25 Both: 20/25  Without Left: >20/200  Right: >20/200  Both: >20/200  Vision Entered By: Shonna Chock CMA (September 23, 2010 11:23 AM)   Primary Care Provider:  Stacie Glaze MD  CC:  Flight CPX-2nd class .  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 Hyperlipidemia Follow-Up: The patient denies muscle aches, itching, constipation, diarrhea, and fatigue.  The patient denies the following symptoms: chest pain/pressure, exercise intolerance, dypsnea, palpitations, syncope, and pedal edema.  Compliance with medications (by patient report) has been near 100%.  Dietary compliance has been good.  The patient reports exercising occasionally.  Adjunctive measures currently used by the patient include ASA and fish oil supplements.  Lipids done in 09/11 were excellent. Hypertension Follow-Up:  The patient denies lightheadedness, urinary frequency, and headaches.  Compliance with medications (by patient report) has been near 100%.  Adjunctive measures currently used  by the patient include salt restriction.  BP @ home 125-135/70.  Current Medications (verified): 1)  Adult Aspirin Low Strength 81 Mg  Tbdp (Aspirin) .... Once Daily 2)  Avodart 0.5 Mg  Caps (Dutasteride) .... Once Daily 3)  Crestor 10 Mg  Tabs (Rosuvastatin Calcium) .... Once Daily 4)  Omega-3 1000 Mg  Caps (Omega-3 Fatty Acids) .... Once Daily 5)  Atenolol 25 Mg  Tabs (Atenolol) .[name] Kitchen.. 1 By Mouth Once Daily 6)  Eye Vitamins   Caps (Multiple Vitamins-Minerals) .... Once Daily 7)  Azor 10-40 Mg  Tabs (Amlodipine-Olmesartan) .... 1/2 Two Times A Day  Allergies: 1)  ! Pcn  Past History:  Past Medical History: Fungal Toenail Infection, S/P toenail removal Hyperlipidemia Hypertension Coronary artery disease ,S/P CABG Peptic ulcer disease, PMH of; H. pylori gastritis 1990  Past Surgical History: S/P Coronary artery bypass graft 2003 Ganglion cystectomy - hand Colonoscopy neg 2008; polyp 2005 Flexible Sigmoidoscopy - 1992, 1997 Inguinal herniorrhaphy Lumbar laminectomy @ L4-5 Prostate biopsy negative, Dr Marcelyn Bruins Hemorrhoidectomy  Family History: F & bro: MI ; M :cns aneurysm, pacer  His father died at age 23 of a suicide after having a myocardial infarction.  His brother  had stents placed @ 59.  Social History: Never Smoked Retired; part Chartered certified accountant for [employer] Married with 2 children no diet Alcohol use-no  Review  of Systems  The patient denies anorexia, fever, weight loss, weight gain, vision loss, decreased hearing, hoarseness, prolonged cough, hemoptysis, melena, hematochezia, severe indigestion/heartburn, hematuria, suspicious skin lesions, depression, unusual weight change, abnormal bleeding, enlarged lymph nodes, and angioedema.    Physical Exam  General:  well-nourished,alert,appropriate and cooperative throughout examination Head:  Normocephalic and atraumatic without obvious abnormalities. No apparent alopecia  Eyes:  No  corneal or conjunctival inflammation noted. EOMI. Perrla. Funduscopic exam benign, without hemorrhages, exudates or papilledema.Field of Vision grossly normal. Ears:  External ear exam shows no significant lesions or deformities.  Otoscopic examination reveals clear canals, tympanic membranes are intact bilaterally without bulging, retraction, inflammation or discharge. Hearing is grossly normal bilaterally. Nose:  External nasal examination shows no deformity or inflammation. Nasal mucosa are pink and moist without lesions or exudates. Septal dislocation Mouth:  Oral mucosa and oropharynx without lesions or exudates.  Teeth in good repair. Neck:  No deformities, masses, or tenderness noted. Lungs:  Normal respiratory effort, chest expands symmetrically. Lungs are clear to auscultation, no crackles or wheezes. Heart:  regular rhythm, no murmur, no gallop, no rub, no JVD, no HJR, and bradycardia.   Abdomen:  Bowel sounds positive,abdomen soft and non-tender without masses, organomegaly or hernias noted. Rectal:  External  tags.  Genitalia:  Testes bilaterally descended without nodularity, tenderness or masses. R atrophic . No scrotal masses or lesions. No penis lesions or urethral discharge. L varicocele.   Prostate:  Dr Logan Bores Msk:  No deformity or scoliosis noted of thoracic or lumbar spine.   Pulses:  R and L carotid,radial,dorsalis pedis and posterior tibial pulses are full and equal bilaterally Extremities:  No clubbing, cyanosis, edema, or deformity noted with normal full range of motion of all joints.   Neurologic:  alert & oriented X3, cranial nerves II-XII intact, sensation intact to light touch, gait normal, DTRs symmetrical and normal, finger-to-nose normal, and Romberg negative.   Skin:  Op scars sternum, LLQ & LS spine Cervical Nodes:  No lymphadenopathy noted Axillary Nodes:  No palpable lymphadenopathy Inguinal Nodes:  No significant adenopathy Psych:  memory intact for recent and  remote, normally interactive, and good eye contact.     Impression & Recommendations:  Problem # 1:  PHYSICAL EXAMINATION (ICD-V70.0) FAA exam  Problem # 2:  CAD, ARTERY BYPASS GRAFT (ICD-414.04) PMH of, no active issues His updated medication list for this problem includes:    Adult Aspirin Low Strength 81 Mg Tbdp (Aspirin) ..... Once daily    Atenolol 25 Mg Tabs (Atenolol) .[name] Kitchen... 1 by mouth once daily    Azor 10-40 Mg Tabs (Amlodipine-olmesartan) .[name] Kitchen... 1/2 two times a day  Problem # 3:  HYPERTENSION, ESSENTIAL NOS (ICD-401.9) Controlled His updated medication list for this problem includes:    Atenolol 25 Mg Tabs (Atenolol) .[name] Kitchen... 1 by mouth once daily    Azor 10-40 Mg Tabs (Amlodipine-olmesartan) .[name] Kitchen... 1/2 two times a day  Problem # 4:  HYPERLIPIDEMIA (ICD-272.2) lipids @ goal His updated medication list for this problem includes:    Crestor 10 Mg Tabs (Rosuvastatin calcium) ..... Once daily  Complete Medication List: 1)  Adult Aspirin Low Strength 81 Mg Tbdp (Aspirin) .... Once daily 2)  Avodart 0.5 Mg Caps (Dutasteride) .... Once daily 3)  Crestor 10 Mg Tabs (Rosuvastatin calcium) .... Once daily 4)  Omega-3 1000 Mg Caps (Omega-3 fatty acids) .... Once daily 5)  Atenolol 25 Mg Tabs (Atenolol) .[name] Kitchen.. 1 by mouth once daily 6)  Eye Vitamins Caps (Multiple vitamins-minerals) .[name] KitchenMarland KitchenMarland Kitchen  Once daily 7)  Azor 10-40 Mg Tabs (Amlodipine-olmesartan) .... 1/2 two times a day  Other Orders: UA Dipstick W/ Micro (manual) (06301)  Patient Instructions: 1)  Carry corrected copy of health history when traveling   Orders Added: 1)  UA Dipstick W/ Micro (manual) [81000] 2)  Est. Patient 65& > [60109]     Laboratory Results   Urine Tests    Routine Urinalysis   Color: yellow Appearance: Clear Glucose: negative   (Normal Range: Negative) Bilirubin: negative   (Normal Range: Negative) Ketone: negative   (Normal Range: Negative) Spec. Gravity: 1.015   (Normal Range:  1.003-1.035) Blood: negative   (Normal Range: Negative) pH: 6.0   (Normal Range: 5.0-8.0) Protein: negative   (Normal Range: Negative) Urobilinogen: 0.2   (Normal Range: 0-1) Nitrite: negative   (Normal Range: Negative) Leukocyte Esterace: negative   (Normal Range: Negative)

## 2010-12-03 NOTE — Assessment & Plan Note (Signed)
Summary: 10 MONTH ROV/NJR pt rsc/njr   Vital Signs:  Patient profile:   70 year old male Height:      72 inches Weight:      190 pounds BMI:     25.86 Temp:     98.2 degrees F oral Pulse rate:   56 / minute Resp:     14 per minute BP sitting:   130 / 76  Vitals Entered By: Willy Eddy, LPN (July 17, 2010 9:08 AM) CC: roa after tread mill and labs work, Hypertension Management Is Patient Diabetic? No   Primary Care Provider:  Stacie Glaze MD  CC:  roa after tread mill and labs work and Hypertension Management.  History of Present Illness: Follow up blood work review the results of the stress test for the FAA lipids at goal and blood pressure in controll stress testing reviewed pt shoud be cleared from FAA point of view no evidence of metabolic syndrome risks are HTN , LIDID and CAD hx risk are controlled per protocols   Hypertension History:      He denies headache, chest pain, palpitations, dyspnea with exertion, orthopnea, PND, peripheral edema, visual symptoms, neurologic problems, syncope, and side effects from treatment.  no chest pain.        Positive major cardiovascular risk factors include male age 61 years old or older, hyperlipidemia, and hypertension.  Negative major cardiovascular risk factors include non-tobacco-user status.        Positive history for target organ damage include ASHD (either angina/prior MI/prior CABG).     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) 9)   Dermatophytosis, Nail  (ICD-110.1) 10)  Hemorrhoids  (ICD-455.6) 11)  Peptic Ulcer Disease  (ICD-533.90) 12)  Gerd  (ICD-530.81)  Current Problems (verified): 1)  Acute Frontal Sinusitis  (ICD-461.1) 2)  Routine General Medical Exam@health  Care Facl  (ICD-V70.0) 3)  Encounter For Long-term Use of Other Medications  (ICD-V58.69) 4)  Cad, Artery Bypass Graft  (ICD-414.04) 5)  Hypertension, Essential Nos  (ICD-401.9) 6)  Hyperlipidemia  (ICD-272.2) 7)  Healthy Adult Male  (ICD-V70.0) 8)  Hyperplasia, 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 Once Daily 2)  Avodart 0.5 Mg  Caps (Dutasteride) .... Once Daily 3)  Crestor 10 Mg  Tabs (Rosuvastatin Calcium) .... Once Daily 4)  Omega-3 1000 Mg  Caps (Omega-3 Fatty Acids) .... Once Daily 5)  Atenolol 25 Mg  Tabs (Atenolol) .[name] Kitchen.. 1 By Mouth Once Daily 6)  Eye Vitamins   Caps (Multiple Vitamins-Minerals) .... Once Daily 7)  Azor 10-40 Mg  Tabs (Amlodipine-Olmesartan) .... 1/2 Two Times A Day 8)  Respivent-D 120-2.5 Mg Xr12h-Tab (Pseudoephedrine-Methscopolamin) .... One Po Two Times A Day 9)  Tussicaps 10-8 Mg Xr12h-Cap (Hydrocod Polst-Chlorphen Polst) .... One By Mouth Two Times A Day As Needed  Current Medications (  verified): 1)  Adult Aspirin Low Strength 81 Mg  Tbdp (Aspirin) .... Once Daily 2)  Avodart 0.5 Mg  Caps (Dutasteride) .... Once Daily 3)  Crestor 10 Mg  Tabs (Rosuvastatin Calcium) .... Once Daily 4)  Omega-3 1000 Mg  Caps (Omega-3 Fatty Acids) .... Once Daily 5)  Atenolol 25 Mg  Tabs (Atenolol) .[name] Kitchen.. 1 By Mouth Once Daily 6)  Eye Vitamins   Caps (Multiple Vitamins-Minerals) .... Once Daily 7)  Azor 10-40 Mg  Tabs (Amlodipine-Olmesartan) .... 1/2 Two Times A Day  Allergies (verified): 1)  ! Pcn  Past  History:  Family History: Last updated: 06/15/2009 Family History Psychiatric care F & bro MI ; M cns aneurysm, pacer  His father died at age 19 of a suicide after having a myocardial infarction.  His brother is 55 and had stents placed.  Social History: Last updated: 06/15/2009 Never Smoked Retired; part Chartered certified accountant Married with 2 children no diet Alcohol use-no  Risk Factors: Exercise: yes (08/30/2007)  Risk Factors: Smoking Status: never (07/17/2010)  Past medical, surgical, family and social histories (including risk factors) reviewed, and no changes noted (except as noted below).  Past Medical History: Reviewed history from 09/03/2009 and no changes required. Fungal Toenail Infection, toenail removed Hyperlipidemia Hypertension Coronary artery disease ,S/P CABG Peptic ulcer disease, PMH of; H. pylori gastritis 1990  Past Surgical History: Reviewed history from 09/03/2009 and no changes required. S/P Coronary artery bypass graft 2003 Ganglion cystectomy - hand Colonoscopy neg 2008; polyp 2005 Flexible Sigmoidoscopy - 1992, 1997 Inguinal herniorrhaphy Lumbar laminectomy @ L4-5 Prostate biopsy negative, Dr Logan Bores Hemorrhoidectomy  Family History: Reviewed history from 06/15/2009 and no changes required. Family History Psychiatric care F & bro MI ; M cns aneurysm, pacer  His father died at age 14 of a suicide after having a myocardial infarction.  His brother is 50 and had stents placed.  Social History: Reviewed history from 06/15/2009 and no changes required. 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         Flu Vaccine Consent Questions     Do you have a history of severe allergic reactions to this vaccine? no    Any prior history of allergic reactions to egg and/or gelatin? no    Do you have a sensitivity to the preservative Thimersol? no    Do you have a past history of Guillan-Barre Syndrome? no    Do you currently have an acute febrile illness? no    Have you ever had a severe reaction to latex? no    Vaccine information given and explained to patient? yes    Are you currently pregnant? no    Lot Number:[id number]   Exp Date:05/03/2011   Site Given  Left Deltoid IM   Physical Exam  General:  Well-developed,well-nourished,in no acute distress; alert,appropriate and cooperative throughout examination Head:  Normocephalic and atraumatic without obvious abnormalities. No apparent alopecia  Eyes:  pupils equal and pupils round.   Ears:  R ear normal and L ear normal.   Nose:  L maxillary sinus tenderness.  Mouth:  posterior lymphoid hypertrophy and postnasal drip.   Neck:  No deformities, masses, or tenderness noted. Lungs:  normal respiratory effort and no wheezes.   Heart:  normal rate and regular rhythm.   Abdomen:  Bowel sounds positive,abdomen soft and non-tender without masses, organomegaly or hernias noted. Msk:  No deformity or scoliosis noted of thoracic or lumbar spine.   Pulses:  R and L carotid,radial,dorsalis pedis and posterior tibial pulses are full and equal bilaterally Extremities:  No clubbing, cyanosis, edema, or deformity noted with normal full range of motion of all joints.   Neurologic:  alert & oriented X3, cranial nerves II-XII intact, strength normal in all extremities, sensation intact to light touch, gait normal, DTRs symmetrical  and normal, finger-to-nose normal, and Romberg negative.     Impression & Recommendations:  Problem # 1:  HYPERTENSION, ESSENTIAL NOS (ICD-401.9)  His updated medication list for this problem includes:    Atenolol 25 Mg Tabs (Atenolol) .[name] Kitchen... 1 by mouth once daily    Azor 10-40 Mg Tabs (Amlodipine-olmesartan) .[name] Kitchen... 1/2 two times a day  Problem # 2:  HYPERLIPIDEMIA (ICD-272.2)  His updated medication list for this problem includes:    Crestor 10 Mg Tabs (Rosuvastatin calcium) ..... Once daily  Labs Reviewed: SGOT: 23 (07/04/2010)   SGPT: 20 (07/04/2010)  Prior 10 Yr Risk Heart Disease: N/A (12/31/2007)   HDL:27.20 (07/04/2010), 31.50 (06/18/2009)  LDL:47 (07/04/2010), 49 (06/18/2009)  Chol:85 (07/04/2010), 101 (06/18/2009)  Trig:54.0 (07/04/2010), 102.0 (06/18/2009)  Complete Medication List: 1)  Adult Aspirin Low Strength 81 Mg Tbdp (Aspirin) .... Once daily 2)  Avodart 0.5 Mg Caps (Dutasteride) .... Once daily 3)  Crestor 10 Mg Tabs (Rosuvastatin calcium) .... Once daily 4)  Omega-3 1000 Mg Caps (Omega-3 fatty acids) .... Once daily 5)  Atenolol 25 Mg Tabs (Atenolol) .[name] Kitchen.. 1 by mouth once daily 6)  Eye Vitamins Caps (Multiple vitamins-minerals) .... Once daily 7)  Azor 10-40 Mg Tabs (Amlodipine-olmesartan) .... 1/2 two times a day  Other Orders: Flu Vaccine 1yrs + MEDICARE PATIENTS (E4540) Administration Flu vaccine - MCR (J8119)  Hypertension Assessment/Plan:      The patient's hypertensive risk group is category C: Target organ damage and/or diabetes.  Today's blood pressure is 130/76.  His blood pressure goal is < 140/90.  Patient Instructions: 1)  Please schedule a follow-up appointment in 6 months. mole removal 2)  BMP prior to visit, ICD-9: 401.60 3)  Hepatic Panel prior to visit, ICD-9:995.20 4)  Lipid Panel prior to visit, ICD-9:272.4

## 2010-12-03 NOTE — Letter (Signed)
Summary: Medical & Student Pilot Certificate/FAA  Medical & Student Pilot Certificate/FAA   Imported By: Lanelle Bal 10/04/2010 10:28:44  _____________________________________________________________________  External Attachment:    Type:   Image     Comment:   External Document

## 2010-12-03 NOTE — Assessment & Plan Note (Signed)
Summary: SICK?   Vital Signs:  Patient profile:   70 year old male Weight:      190 pounds O2 Sat:      92 % on Room air Temp:     100 degrees F oral Pulse rate:   67 / minute Pulse rhythm:   regular BP sitting:   120 / 70  (left arm) Cuff size:   large  Vitals Entered By: Mervin Hack CMA Duncan Dull) (November 24, 2009 9:20 AM)  O2 Flow:  Room air CC: cold, Cough   History of Present Illness:  Cough      This is a 70 year old man who presents with Cough.  The patient reports productive cough and wheezing, but denies fever.  Associated symtpoms include cold/URI symptoms and nasal congestion.  Ineffective prior treatments have included OTC cough medication.    Current Medications (verified): 1)  Adult Aspirin Low Strength 81 Mg  Tbdp (Aspirin) .... Once Daily 2)  Avodart 0.5 Mg  Caps (Dutasteride) .... Once Daily 3)  Crestor 10 Mg  Tabs (Rosuvastatin Calcium) .... Once Daily 4)  Omega-3 1000 Mg  Caps (Omega-3 Fatty Acids) .... Once Daily 5)  Atenolol 25 Mg  Tabs (Atenolol) .[name] Kitchen.. 1 By Mouth Once Daily 6)  Eye Vitamins   Caps (Multiple Vitamins-Minerals) .... Once Daily 7)  Azor 10-40 Mg  Tabs (Amlodipine-Olmesartan) .... 1/2 Two Times A Day 8)  Tamiflu 75 Mg Caps (Oseltamivir Phosphate) .[name] Kitchen.. 1 By Mouth Two Times A Day 9)  Zithromax Z-Pak 250 Mg Tabs (Azithromycin)  Allergies: 1)  ! Pcn  Past History:  Past medical, surgical, family and social histories (including risk factors) reviewed for relevance to current acute and chronic problems.  Past Medical History: Reviewed history from 09/03/2009 and no changes required. Fungal Toenail Infection, toenail removed Hyperlipidemia Hypertension Coronary artery disease ,S/P CABG Peptic ulcer disease, PMH of; H. pylori gastritis 1990  Past Surgical History: Reviewed history from 09/03/2009 and no changes required. S/P Coronary artery bypass graft 2003 Ganglion cystectomy - hand Colonoscopy neg 2008; polyp 2005 Flexible  Sigmoidoscopy - 1992, 1997 Inguinal herniorrhaphy Lumbar laminectomy @ L4-5 Prostate biopsy negative, Dr Logan Bores Hemorrhoidectomy  Family History: Reviewed history from 06/15/2009 and no changes required. Family History Psychiatric care F & bro MI ; M cns aneurysm, pacer  His father died at age 83 of a suicide after having a myocardial infarction.  His brother is 73 and had stents placed.  Social History: Reviewed history from 06/15/2009 and no changes required. 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  Otoscopic examination reveals clear canals, tympanic membranes are intact bilaterally without bulging, retraction, inflammation or discharge. Hearing is grossly normal bilaterally. Nose:  External nasal examination shows no deformity or inflammation. Nasal mucosa are pink and moist without lesions or exudates. Mouth:  Oral mucosa and oropharynx without lesions or exudates.  Teeth in good repair. Neck:  No deformities, masses, or tenderness noted. Lungs:  R decreased breath sounds and L decreased breath sounds.   Heart:  normal rate.   Skin:  Intact without suspicious lesions or rashes Cervical Nodes:  No lymphadenopathy noted Psych:  Oriented X3 and normally interactive.     Impression & Recommendations:  Problem # 1:  BRONCHITIS- ACUTE (ICD-466.0)  finish zpack---2-3 days left ? flu---secondary to symptoms and high fever but if worsens over weekend go to UC or ER  otherwise f/u Monday if no better---May need CXR r/o pneumonia cont mucinex His updated medication list for this problem includes:    Zithromax Z-pak 250 Mg Tabs (Azithromycin)  Orders: Flu A+B (16109)  Take antibiotics  and other medications as directed. Encouraged to push clear liquids, get enough rest, and take acetaminophen as needed. To be seen in 5-7 days if no improvement, sooner if worse.  Complete Medication List: 1)  Adult Aspirin Low Strength 81 Mg Tbdp (Aspirin) .... Once daily 2)  Avodart 0.5 Mg Caps (Dutasteride) .... Once daily 3)  Crestor 10 Mg Tabs (Rosuvastatin calcium) .... Once daily 4)  Omega-3 1000 Mg Caps (Omega-3 fatty acids) .... Once daily 5)  Atenolol 25 Mg Tabs (Atenolol) .[name] Kitchen.. 1 by mouth once daily 6)  Eye Vitamins Caps (Multiple vitamins-minerals) .... Once daily 7)  Azor 10-40 Mg Tabs (Amlodipine-olmesartan) .... 1/2 two times a day 8)  Tamiflu 75 Mg Caps (Oseltamivir phosphate) .[name] Kitchen.. 1 by mouth two times a day 9)  Zithromax Z-pak 250 Mg Tabs (Azithromycin) Prescriptions: TAMIFLU 75 MG CAPS (OSELTAMIVIR PHOSPHATE) 1 by mouth two times a day  #10 x 0   Entered and Authorized by:   Loreen Freud DO   Signed by:   Loreen Freud DO on 11/24/2009   Method used:   Electronically to        [facility]* (retail)       [address]       [place], [place]  [address]       Ph: [phone] or [phone]       Fax: [phone]   RxID:   [id number]   Current Allergies (reviewed today): ! PCN

## 2010-12-03 NOTE — Letter (Signed)
Summary: [facility] year follow up  [facility]-1 year follow up   Imported By: Maryln Gottron 04/10/2010 14:59:26  _____________________________________________________________________  External Attachment:    Type:   Image     Comment:   External Document

## 2010-12-03 NOTE — Progress Notes (Signed)
Summary: Need Records  Phone Note Call from Patient Call back at Home Phone [phone]   Caller: Patient Complaint: Breathing Problems Summary of Call: Pt need a copy of his blood work that was done today,alone with letter Dr. Excell Seltzer in mailing  Initial call taken by: Judie Grieve,  July 04, 2010 12:26 PM  Follow-up for Phone Call        Dr Excell Seltzer to dictate letter for pt.  Will mail letter, GXT report and labs to pt when complete. Julieta Gutting, RN, BSN  July 04, 2010 6:22 PM  Additional Follow-up for Phone Call Additional follow up Details #1::        Letter dictated - dictation number [phone]. Additional Follow-up by: Norva Karvonen, MD,  July 04, 2010 6:40 PM     Appended Document: Need Records FAA packet placed at the front desk for pt to pick-up.

## 2010-12-05 NOTE — Letter (Signed)
Summary: Letter Regarding Medical Certificate/FAA  Letter Regarding Medical Certificate/FAA   Imported By: Lanelle Bal 10/14/2010 13:11:29  _____________________________________________________________________  External Attachment:    Type:   Image     Comment:   External Document

## 2010-12-05 NOTE — Progress Notes (Signed)
  Phone Note Call from Patient   Reason for Call: Acute Illness Summary of Call: need appt for mole removal- had to cx appt for tomrrow and has rov 3-16 ok to wait or go somewhere else--[phone] Initial call taken by: Willy Eddy, LPN,  November 26, 2010 4:43 PM  Follow-up for Phone Call        per dr Lovell Sheehan- may wait until ov in march Follow-up by: Willy Eddy, LPN,  November 27, 2010 1:20 PM

## 2011-01-04 ENCOUNTER — Other Ambulatory Visit: Payer: Self-pay | Admitting: Internal Medicine

## 2011-01-08 ENCOUNTER — Other Ambulatory Visit (INDEPENDENT_AMBULATORY_CARE_PROVIDER_SITE_OTHER): Payer: Medicare Other | Admitting: Internal Medicine

## 2011-01-08 DIAGNOSIS — T887XXA Unspecified adverse effect of drug or medicament, initial encounter: Secondary | ICD-10-CM

## 2011-01-08 DIAGNOSIS — E785 Hyperlipidemia, unspecified: Secondary | ICD-10-CM

## 2011-01-08 DIAGNOSIS — I10 Essential (primary) hypertension: Secondary | ICD-10-CM

## 2011-01-08 LAB — LIPID PANEL
HDL: 24.8 mg/dL — ABNORMAL LOW (ref >39.00)
LDL Cholesterol: 40 mg/dL (ref 0–99)
Total CHOL/HDL Ratio: 3
VLDL: 12.4 mg/dL (ref 0.0–40.0)

## 2011-01-08 LAB — HEPATIC FUNCTION PANEL
Alkaline Phosphatase: 42 U/L (ref 39–117)
Bilirubin, Direct: 0.1 mg/dL (ref 0.0–0.3)
Total Bilirubin: 0.5 mg/dL (ref 0.3–1.2)
Total Protein: 6 g/dL (ref 6.0–8.3)

## 2011-01-08 LAB — BASIC METABOLIC PANEL
CO2: 30 mEq/L (ref 19–32)
Calcium: 8.7 mg/dL (ref 8.4–10.5)
Sodium: 143 mEq/L (ref 135–145)

## 2011-01-16 ENCOUNTER — Encounter: Payer: Self-pay | Admitting: Internal Medicine

## 2011-01-17 ENCOUNTER — Encounter: Payer: Self-pay | Admitting: Internal Medicine

## 2011-01-17 ENCOUNTER — Other Ambulatory Visit: Payer: Self-pay | Admitting: Internal Medicine

## 2011-01-17 ENCOUNTER — Ambulatory Visit (INDEPENDENT_AMBULATORY_CARE_PROVIDER_SITE_OTHER): Payer: Medicare Other | Admitting: Internal Medicine

## 2011-01-17 VITALS — BP 120/80 | HR 72 | Temp 98.2°F | Resp 14 | Ht 72.0 in | Wt 178.0 lb

## 2011-01-17 DIAGNOSIS — C44310 Basal cell carcinoma of skin of unspecified parts of face: Secondary | ICD-10-CM

## 2011-01-17 DIAGNOSIS — C44319 Basal cell carcinoma of skin of other parts of face: Secondary | ICD-10-CM

## 2011-01-17 NOTE — Progress Notes (Signed)
  Subjective:    Patient ID: Russell [name], male    DOB: Mar 13, 1941, 70 y.o.   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   Review of Systems     Objective:   Physical Exam        Assessment & Plan:   patient is a 70 year old white male who presents for excision of what is believed to be a basal cell carcinoma on his right temporal parietal bleed growing lesion was noted on his right temple he treated it with cryotherapy and it did not resolve and came back very quickly so he presents today for wide excision and suture closure to the site.  Patient was educated about the risk and benefit of this removal of this lesion he gave informed consent local anesthesia was obtained with 2% lidocaine with epi using a 15 blade a 3 inch excision was made to the site removing the lesion closure was obtained with 60 sutures and a cosmetic procedure and sterile bandage placed wound care was discussed with the patient an appointment was made for suture removal and review of pathology patient tolerated the procedure well and expressed understanding of this facts.

## 2011-02-10 ENCOUNTER — Telehealth: Payer: Self-pay | Admitting: Internal Medicine

## 2011-02-10 NOTE — Telephone Encounter (Signed)
The FAA should be notified of the basal cell cancer. They would need a copy of the office note and the path report. This should have no bearing on the certificate, but they prefer to know of any health changes in "real-time" as opposed to waiting until recertification appt.[employer]

## 2011-02-10 NOTE — Telephone Encounter (Signed)
F Y I only------patient called to let Dr Alwyn Ren know that he had a basil cell carcinoma removed from his temple by Dr Darryll Capers on 01/17/2011---patient says the "margins were good"----he did not want to wait to give you this information when he has his flight physical in the fall---his number is [phone]---he does not need you to call him--he just wanted you to have the info

## 2011-02-12 NOTE — Telephone Encounter (Signed)
Please fax this  Phone note  to Dr. Darryll Capers his primary care physician.

## 2011-02-13 NOTE — Telephone Encounter (Signed)
DO you notify FAA for this?

## 2011-02-18 NOTE — Telephone Encounter (Signed)
Copies of the records from Dr. Darryll Capers office should be mailed to the [facility] with the  Pilot ID #  number attached. It does not need to come from my office.

## 2011-03-18 NOTE — Letter (Signed)
July 04, 2010     RE:  [name], [name]  MRN:  [id number]  /  DOB:  1941-08-30   To Whom It May Concern:   Russell [name] is a 70 year old gentleman with coronary artery  disease and prior coronary artery bypass surgery in 2003.  He has been a  patient of mine over the last several years.  He was evaluated on  July 04, 2010, with an exercise treadmill stress study.  The patient  has had no cardiovascular symptoms since his last evaluation here 1 year  ago.  He specifically denied chest pain, dyspnea, palpitations,  lightheadedness, edema, or syncope.   The patient's baseline EKG showed sinus bradycardia and was otherwise  within normal limits.  He exercised for a total of 10 minutes according  to the Bruce protocol, achieving a work load of 11.7 metabolic  equivalents.  His maximum blood pressure was 179/68 with a maximum heart  rate of 157 beats per minute.  This was greater than 100% of his maximum  predicted heart rate.  The patient had no chest pain, sustained  arrhythmia, or significant ST-segment change with exertion.  He did have  isolated PVCs without symptoms.  His double product was greater than  25,000.   Lab work in the office today revealed normal renal function with a  creatinine of 1.1, potassium was 4.1.  Lipids are under excellent  control with a total cholesterol of 85, triglycerides 54, HDL 27, and  LDL 47.  Liver function tests were within normal limits, as was thyroid  function with a TSH of 1.3.   In summary, Russell [name] remain stable from a cardiovascular standpoint.  He has good exercise capacity and no anginal symptoms.  His  cardiovascular risk factors remain under excellent control.   If there are any questions regarding the condition of Russell [name], please  feel free to contact me at any time.    Sincerely,      Veverly Fells. Excell Seltzer, MD  Electronically Signed    MDC/MedQ  DD: 07/04/2010  DT: 07/05/2010  Job #: [id number]

## 2011-03-18 NOTE — Letter (Signed)
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 [address]  [place], [place] [address]   RE:  Russell, [name]  MRN:  [id number]  /  DOB:  12-21-1940   Dear Dr. Bernestine Amass:   It was my pleasure to see Russell [name] as an outpatient at the [facility]  Cardiology office on June 10, 2007.  Russell [name] is a very nice 70-year-  old gentleman who has undergone coronary bypass surgery in January 2003.  He has been followed by Dr. Glennon Hamilton in the past and I will be  assuming his care from here forward.  It appears that Russell [name] has  done very well from a symptomatic standpoint since his coronary bypass.   Specifically, he denies any chest pain, dyspnea, lightheadedness,  syncope, palpitations, orthopnea, PND, edema or claudication symptoms.  He regularly checks his blood pressure and has noted some minor  elevation in his systolic blood pressure in the evening with systolic  pressures in the 140s.  In the morning, he has excellent control with  systolic pressures in the 120s.   PERTINENT PAST MEDICAL HISTORY:  1. Coronary bypass surgery as noted in 2003, when he underwent five-      vessel bypass with a  LIMA to LAD, saphenous vein graft to the      first diagonal, saphenous vein graft to the fist OM branch of the      left circumflex and a sequential saphenous vein graft to the second      and third obtuse marginal branches of the left circumflex.  2. Essential hypertension.  3. Dyslipidemia.   CURRENT MEDICATIONS:  1. Atenolol 25 mg daily.  2. Avodart 0.5 mg daily.  3. Crestor 10 mg daily.  4. Aspirin 81 mg two daily.  5. Amlodipine 5 mg daily.  6. Olmesartan 40 mg daily.  7. Omega-3 fish oil.   ALLERGIES:  PENICILLIN.   PHYSICAL EXAM:  The patient is alert and oriented and he is in no acute  distress.  His weight is 204 pounds.  Blood pressure is 139/77 in the  right arm, 142/81 in the  left arm, heart rate 60, respiratory rate 16.  HEENT:  Normal.  Neck:  Normal carotid upstrokes without bruits.  Lungs  are clear to auscultation bilaterally.  The heart is regular rate and  rhythm without murmurs or gallops.  Abdomen soft and nontender.  No  organomegaly.  No abdominal bruits.  Extremities:  No clubbing,  cyanosis, or edema.  Peripheral pulses are 2+ and equal throughout.   Recent testing includes a comprehensive metabolic panel that was drawn  on August 1; pertinent findings included a normal creatinine of 1.1,  minor elevation of glucose at 108 and potassium of 4.3.  Liver function  tests were all within normal limits with an AST of 22 and an ALT of 24.  The patient's total cholesterol was 96 with triglycerides of 119, HDL of  30 and LDL of 42.   He underwent an exercise Myoview stress study.  He exercised for 10  minutes according to the Bruce protocol and did not have any chest pain  or significant ST-segment changes.  He was noted to have a hypertensive  response to exercise, but he held his beta blocker for 48 hours prior.  There was mild decreased activity in the inferolateral wall that was  unchanged compared to the rest images.  There was normal contractility  in all areas of the myocardium and the LVEF was calculated at 64%.  The  overall impression was that this was a low-risk stress nuclear study.   ASSESSMENT:  Russell [name] is stable from a cardiovascular standpoint.  His  cardiac problems are as follows:  1. Coronary artery disease, status post coronary bypass surgery with      no evidence of recurrent ischemia on stress testing and no angina      by history.  He should continue on medical therapy for his coronary      artery disease, which includes a beta blocker, statin for his      hyperlipidemia and aspirin.  See below for discussion of his      hypertension.  2. Dyslipidemia.  His lipids are optimal with the exception of a low      HDL.  His LDL is  very low on Crestor.  He has taken Niaspan in the      past, but did not have any improvement in his HDL.  I do not      recommend any changes today.  3. Hypertension.  He is on a combination of Amlodipine, olmesartan,      and atenolol.  He is taking a low dose of once-daily atenolol and I      have recommended that he increase this to 25 mg twice daily.      Atenolol oftentimes loses his efficacy prior to 24 hours and is      better dosed twice daily.  I think he may have better around-the-      clock blood pressure control with      twice-daily dosing.  I did not make any other changes to his blood      pressure medicines today.  4. Followup:  For followup, I would like to see Mr. Saputo back on a      yearly basis or sooner if any new problems arise.    Sincerely,      Veverly Fells. Excell Seltzer, MD  Electronically Signed    MDC/MedQ  DD: 06/10/2007  DT: 06/11/2007  Job #: [id number]

## 2011-03-18 NOTE — Procedures (Signed)
[facility]                              EXERCISE TREADMILL   NAME:[name], [name]                     MRN:          [id number]  DATE:06/16/2008                            DOB:          01/01/1941    PROCEDURE:  Exercise treadmill stress test.   INDICATION:  Coronary artery disease status post CABG.   The patient exercised according to the Bruce protocol for 9 minutes and  35 seconds, achieving a maximum work level of a 11 metabolic  equivalents.  The heart rate rose from 75 beats per minute at rest to a  maximum of 157 beats per minute.  This represents 101% of the maximal  age-predicted heart rate.  The blood pressure rose from a resting BP of  134/84 to a maximum of 184/63.  The patient experienced no chest pain  with exertion.  There were no significant ST-segment changes or  arrhythmias.  The test was stopped due to achieving maximum predicted  heart rate.  Resting EKG demonstrated normal sinus rhythm with PVCs.  With exercise, the patient maintained sinus rhythm in to sinus  tachycardia and there were an overall reduced number of PVCs.   FINAL CONCLUSIONS:  Negative exercise stress test for ischemia.     Veverly Fells. Excell Seltzer, MD  Electronically Signed    MDC/MedQ  DD: 06/20/2008  DT: 06/21/2008  Job #: [id number]

## 2011-03-21 NOTE — Procedures (Signed)
[facility]                                EXERCISE TREADMILL   NAME:Russell [name], Russell [name]                     MRN:          [id number]  DATE:06/30/2006                            DOB:          [birth year]    TREADMILL EXERCISE ELECTROCARDIOGRAM:  The patient is on Toprol, Crestor and  Lotrel; the Lotrel and Toprol are on hold.  The patient exercised for 10  minutes and 14 seconds on the Bruce protocol with a maximum heart rate of  157.  He had some systolic hypertension.  He had no chest discomfort,  shortness of breath or dizziness.  There were a few PAC's, a few PVC's, but  no evidence of ST-T abnormality.   IMPRESSION:  Negative maximal treadmill exercise electrocardiogram.                                   E. Graceann Congress, MD, [facility]   EJL/MedQ  DD:  06/30/2006  DT:  07/01/2006  Job #:  [id number]

## 2011-03-21 NOTE — Discharge Summary (Signed)
[place]. [facility]  Patient:    Russell [name], Russell [name] Visit Number: [id number] MRN: [id number]          Service Type: MED Location: [id number] Attending Physician:  Glennon Hamilton Dictated by:   Rozell Searing, P.A. Admit Date:  11/10/2001 Disc. Date: 11/12/01   CC:         Stacie Glaze, M.D. [facility]  Alleen Borne, M.D.   Referring Physician Discharge [place]  PROCEDURES:  Coronary angiography, November 11, 2001.  REASON FOR ADMISSION:  Please refer to dictated admission note.  HOSPITAL COURSE:  Patient was admitted directly from the office for management of unstable angina pectoris.  Serial cardiac enzymes were normal.  Patient was maintained on intravenous heparin in the interim prior to proceeding with cardiac catheterization.  Coronary angiography performed January 9 by Dr. Nedra Hai (see report for full details) revealed severe two-vessel coronary artery disease with normal LV function.  Specifically, there was 30% distal tapering of the LMCA; 100% mid LAD; 70% DX-1; 70% circumflex (between OM-1 and OM-2); 90% OM-2; normal, nondominant RCA.  Following review of films with colleagues, and in long consultation with the patient and family regarding the various options, recommendation was to proceed with bypass surgery as the best therapeutic option.  Patient was subsequently evaluated by Dr. Laneta Simmers, who concurred with this decision. Arrangements were then made to have patient leave on LOA status to return the following week for elective bypass surgery.  LABORATORY DATA:  Cardiac enzymes:  CPK/MB and troponin I normal x 2.  CBC normal at discharge.  Metabolic profile normal.  Lipid profile:  Total cholesterol 186, triglyceride 133, HDL 39, LDL 120, cholesterol/HDL ratio 4.8.  Admission CXR:  Negative.  DISCHARGE MEDICATIONS: 1. Lopressor 25 mg b.i.d. 2. Aspirin 81 mg q.d. 3. Zocor 20 mg q.h.s. 4. Nitrostat as directed.  INSTRUCTIONS:  ACTIVITY:   Patient is to refrain from any work or strenuous activity until he returns to the hospital.  SPECIAL INSTRUCTIONS:  He is scheduled for elective bypass surgery by Dr. Laneta Simmers on Wednesday, November 17, 2001.  DISCHARGE DIAGNOSES: 1. Severe two-vessel coronary artery disease.    a. Elective coronary artery bypass grafting recommended.    b. Normal serial cardiac enzymes.    c. Normal left ventricular function.    d. Abnormal stress Cardiolite on November 10, 2001. 2. Dislipidemia. 3. Hypertension. Dictated by:   Rozell Searing, P.A. Attending Physician:  Glennon Hamilton DD:  11/12/01 TD:  11/12/01 Job: [id number]

## 2011-03-21 NOTE — Assessment & Plan Note (Signed)
[facility]                            CARDIOLOGY OFFICE NOTE   Russell, [name]                     MRN:          [id number]  DATE:01/08/2007                            DOB:          [birth year]    [name] is a very pleasant 70 year old white male with  coronary artery disease now 5 years post CABG by Dr. Evelene Croon. He  has had no recurrent symptoms. He had a very complete evaluation for the  [employer] in September. that report is reviewed. The stress test was negative  with the patient achieving 10 minutes and 14 seconds of Bruce protocol.  He has had no cardiac symptoms and is feeling quite well. Recently  because of cough, he was switched from Lotrel to Azor 5/0.2 by Dr.  Lovell Sheehan. He is also on Avodart 0.5, Crestor 10, aspirin 81, Toprol XL  50.   PHYSICAL EXAMINATION:  VITAL SIGNS:  Blood pressure 126/70, pulse 51 in  sinus bradycardia.  GENERAL:  Normal.  NECK:  JVP is not elevated.  CARDIAC:  Normal.  LUNGS:  Clear.  ABDOMEN:  Unremarkable.  EXTREMITIES:  Normal.   EKG reveals sinus bradycardia, minor nonspecific T changes.  Imp:  The patient appears to be doing quite well 5 years post CABG. His lipids  are hypertension are well controlled.  Plan  I have suggested a followup lipid and BNP and I will schedule him to see  Dr. Tonny Bollman in August.     Cecil Cranker, MD, [facility]  Electronically Signed    EJL/MedQ  DD: 01/08/2007  DT: 01/08/2007  Job #: [id number]

## 2011-03-21 NOTE — Letter (Signed)
June 20, 2008    Satira Anis, M.D.  [facility],  [address] [address]  [place], [place] [address]   RE:  Russell [name], Russell [name]  MRN:  [id number]  /  DOB:  Aug 23, 1941   Dear Dr. Bernestine Amass:   It was my pleasure to see Russell [name] for his yearly evaluation at  the [facility] Card[facility] Office on June 16, 2008.  He is a 70 year old  gentleman with coronary artery disease who has undergone coronary bypass  surgery in 2003.  Russell [name] continues to do very well from a  symptomatic standpoint.  He maintains a regular exercise regimen and has  no symptoms with exertion.  He specifically denies chest pain, dyspnea,  edema, lightheadedness, syncope, palpitations, orthopnea or claudication  symptoms.  He follows his blood pressure regularly at home and reports  systolic readings in the 130s and diastolic readings in the 70s.  At the  time of his last visit 1 year ago, I asked him to increase his atenolol  to twice daily dosing.  However, he was unable to tolerate this  secondary to a symptomatic hypotension.  He remains on once daily,  atenolol.  He reports no problems at today's visit.   MEDICATIONS:  1. Atenolol 25 mg once daily.  2. Avodart 0.5 mg daily.  3. Crestor 10 mg daily.  4. Aspirin 81 mg 2 daily.  5. Omega-3 fish oil daily.  6. Azor 5/40 mg 1/2 twice daily.   ALLERGIES:  PENICILLIN.   PHYSICAL EXAMINATION:  GENERAL:  The patient is alert and oriented.  He  is in no acute distress.  He is a healthy-appearing, age-appropriate  white male.  VITAL SIGNS:  Blood pressure is 134/84, heart rate is 71, and  respiratory rates 12.  HEENT:  Normal.  NECK:  Normal carotid upstrokes without bruits.  JVP normal.  LUNGS:  Clear bilaterally.  HEART:  Regular rate and rhythm.  There are no murmurs or gallops.  ABDOMEN:  Soft, nontender.  No organomegaly.  No abdominal bruit.  EXTREMITIES:  No clubbing, cyanosis or edema.  Peripheral pulses are 2+  and equal  throughout.  SKIN:  Warm and dry without rash.   LABORATORY DATA:  Lipids from June 09, 2008, show a cholesterol of 91,  HDL 29, LDL 48, triglycerides 66, and glucose is 110.  LFTs are normal  with an AST of 20, alkaline phosphatase 46, and total bilirubin 0.8.   EKG normal sinus rhythm, PVCs.  There were no ST-segment or T-wave  changes.   ASSESSMENT:  1. Coronary artery disease, status post coronary artery bypass graft.      The patient remains stable.  He has excellent control of his      cholesterol and hypertension.  Continue current medical therapy      without changes.  I encouraged him to increase his exercise program      with a goal towards weight loss in the setting of mild      hyperglycemia.  As the patient underwent an exercise treadmill      stress test study today where he had an excellent performance.  He      achieved 11 metabolic equivalents, 101% of his maximum predicted      heart rate, and experience no chest pain or significant EKG      changes.  I would like to see him back in 1 year for followup.  2. Dyslipidemia.  Again, lipids are optimal with the exception  of a      low HDL.  His LDL is at goal.  He has failed Niaspan in the past      due to lack of efficacy.  Continue Crestor at current dose with no      changes.  3. Hypertension.  The patient has excellent blood pressure control.      Continue current medical regimen.  As detailed above, he was unable      to tolerate an increase in his atenolol.   For followup, I would like to see Russell [name] back next year.  He will be  due for an exercise nuclear stress test next year.   If there are any questions, please feel free to contact me at any time.    Sincerely,      Russell Fells. Excell Seltzer, MD  Electronically Signed    MDC/MedQ  DD: 06/20/2008  DT: 06/20/2008  Job #: [id number]

## 2011-03-21 NOTE — Op Note (Signed)
[place]. [facility]  Patient:    Russell [name], Russell [name]                       MRN: [id number] Proc. Date: 02/17/00 Attending:  Reinaldo Meeker, M.D.                           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  PROCEDURE:  Left L3 laminotomy for excision of herniated disk with the operating microscope.  SECONDARY PROCEDURE:  Microdissection of the L3-4 disk as well as L3 and L4 nerve roots.  SURGEON:  Reinaldo Meeker, M.D.  ASSISTANT:  Julio Sicks, M.D.  PROCEDURE IN DETAIL:  After being placed in the prone position, the patients back] was prepped and draped in the usual sterile fashion.  A localizing x-ray was taken prior to incision to identify the appropriate levels.  A midline incision was made over the spinous processes of L3 and L4 and distal to the spinous process. Using the Bovie cutting current, the incision was carried down to the spinous processes. Subperiosteal dissection was then carried out along the left side of the spinous process of L2 and L3.  The superior edge of the L4 lamina was also exposed.  An  x-ray was taken once again to confirm the approach to the appropriate level. This was correct.  At this point, the high speed drill was used on the patients left  side to basically do a complete hemilaminectomy at L3.  This included removing he medial 1/3 of the facet joint.  At this point, the microscope was draped, brought onto the field and used for the remainder of the case.  Using microdissection technique, the lateral aspect of the thecal sac and L4 nerve root were identified. Coagulation was carried out on the floor of the canal and the disk space was incised with a #15 blade.  A disk space clean out was then carried out with pituitary rongeurs and curets.  Inspection was then carried out  superior to the  disk space, where the subligamentous fragment was identified.  The disk was tracked all the way up until the L3 nerve root could be identified.  The ______ ligament over the disk material was incised and the fragments of disk material then had migrated superiorly and were removed in a piece meal fashion.  At this point, the L3 nerve root was found to be well decompressed.  Inspection was carried out in all directions for any evidence of residual compression, and none could be identified. Large amounts of irrigation were carried out and any bleeding controlled with bipolar coagulation and Gelfoam.  The wound was then closed using interrupted Vicryl on the muscle, fascia, subcutaneous and subcuticular tissue and staples n the skin.  Sterile dressings were then applied and the patient was extubated and taken to the recovery room in stable condition. DD:  02/17/00 TD:  02/17/00 Job: [id number]

## 2011-03-21 NOTE — Cardiovascular Report (Signed)
[place]. [facility]  Patient:    Russell [name], Russell [name] Visit Number: [id number] MRN: [id number]          Service Type: MED Location: [phone] Attending Physician:  Glennon Hamilton Dictated by:   Veneda Melter, M.D. Proc. Date: 11/11/01 Admit Date:  11/10/2001   CC:         Stacie Glaze, M.D. [facility]  Bea Laura Graceann Congress, M.D. [facility]  CVTS   Cardiac Catheterization  PROCEDURE: 1. Left heart catheterization 2. Left ventriculogram. 3. Selective coronary angiography. 4. Abdominal aortogram.  DIAGNOSES: 1. Severe two-vessel coronary artery disease. 2. Normal left ventricular systolic function.  CARDIOLOGIST:  Veneda Melter, M.D.  HISTORY:  [name] is a 70 year old white male with multiple cardiac risk factors including strong family history who presents with progressive fatigue. The patient underwent stress imaging study showing marked EKG abnormalities as well as a significant amount of ischemia in the inferolateral walls.  He presents now for further cardiac assessment.  TECHNIQUE:  After informed consent was obtained, the patient was brought to the catheterization lab.  A 6-French sheath was placed in the right femoral artery.  Left heart catheterization and left ventriculography were then performed using preformed 6-French Judkins catheters.   An abdominal aortogram was also performed using power injection of contrast through the pigtail catheter.  At the termination of the case, catheters and sheaths were removed and manual pressure applied until adequate hemostasis was achieved.  The patient tolerated the procedure well and was transferred to the floor in stable condition.  FINDINGS: 1. Left main trunk: A large caliber vessel with distal taper of 30%. 2. LAD.  This is a medium caliber vessel that provides a major first diagonal    branch in the proximal segment as well as two septal perforators.  The LAD    has moderate disease in the proximal  segment of 50 to 60%.  It is then 100%    occluded after the takeoff of the second septal perforator and diagonal    branch.  The distal vasculature is seen to fill via collateral flow.  The    first diagonal branch has proximal narrowing of 705. 3. Left circumflex artery: This is a medium caliber vessel that provides    three marginal branches as well as the posterior descending artery.  The    mid A-V circumflex has moderate disease of 60 to 70% between the first and    second marginal branches.  The first marginal branch has mild    irregularities.  The second marginal branch bifurcates in its mid    section.  It has severe narrowing of 70% in the proximal segment and    high-grade narrowing of 90% in the inferior division.  The third marginal    branch has mild irregularities as does the posterior descending artery. 4. Right coronary artery is nondominant.  This is a small caliber vessel    that provides an RV marginal branch as well as a small posterior    ventricular branch. 5. LV:  Normal end-systolic and end-diastolic dimensions.  Overall left    ventricular function is well preserved.  Ejection fraction is greater than    60%.  No mitral regurgitation.  LV pressure is 110/5, aortic 110/70.    LV EDP equals 10.  ABDOMINAL AORTOGRAM:  Abdominal aorta is of normal caliber with trivial luminal irregularities.  The iliac arteries have luminal irregularities as well. There is one left renal artery which is  widely patent.  There are two right renal arteries which are widely patent with only mild irregularities.  ASSESSMENT/PLAN:  [name] is a 70 year old gentleman who presents with progressive fatigue and abnormal stress imaging study.  He has evidence of severe two-vessel coronary artery disease involving a dominant left circumflex system and well preserved left ventricular function.  Due to the complexity of the lesions as well as the high risk of restenosis, he will be referred  for surgical revascularization. Dictated by:   Veneda Melter, M.D. Attending Physician:  Glennon Hamilton DD:  11/11/01 TD:  11/11/01 Job: [id number]

## 2011-03-21 NOTE — Op Note (Signed)
[place]. [facility]  Patient:    Russell [name]                        MRN: [id number] Proc. Date: 09/19/99 Adm. Date:  [id number] Attending:  Bonnetta Barry CC:         Stacie Glaze, M.D. [facility]                           Operative Report  PREOPERATIVE DIAGNOSIS: 1. Chronic prolapsed hemorrhoid. 2. Grade 2 internal hemorrhoids.  POSTOPERATIVE DIAGNOSIS:  OPERATION PERFORMED: 1. Hemorrhoidectomy (simple). 2. Rubber band ligation x 2.  SURGEON:  Velora Heckler, M.D.  ANESTHESIA:  General.  ESTIMATED BLOOD LOSS:  Minimal.  PREPARATION:  Betadine.  COMPLICATIONS:  None.  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  He has had intermittent bleeding. The patient notes chronic prolapse of one hemorrhoidal column.  He now comes to surgery for hemorrhoidectomy and rubber band ligation.  DESCRIPTION OF PROCEDURE:  The procedure was done in OR #2 at the [facility].  The patient was brought to the operating room and placed in supine position on the operating room table.  Following administration of general anesthesia, the patient was prepped and draped in the usual strict aseptic fashion. After ascertaining that an adequate level of anesthesia had been obtained, digital rectal exam was performed.  Anoscopy was performed.  The right anterior column shows a chronic prolapsed hemorrhoid.  The column was injected with local anesthetic with epinephrine.  A 3-0 chromic gut suture ligature was placed at the apex of the hemorrhoidal column and tied securely.  Using sharp dissection, the  chronic prolapsed skin tag and hemorrhoidal column were excised sharply. Mucosal edges were reapproximated with a running 3-0 chromic gut suture.  Good hemostasis was noted.  In the right posterior column there are prominent grade 2 internal hemorrhoids which were  rubber band ligated.  Also in the left lateral column there is prominent hemorrhoidal column which is rubber band ligated.  Local anesthetic was injected circumferentially.  Gelfoam packing was placed in the anal canal.  Sterile gauze dressings were placed on the perineum.  The patient was awakened rom anesthesia and brought to the recovery room in stable condition.  The patient tolerated the procedure well. DD:  09/19/99 TD:  09/20/99 Job: [id number]

## 2011-03-21 NOTE — Letter (Signed)
July 13, 2006     Dr. Satira Anis, Manager  Aerospace Medical Certification Division,  [facility]  [address] [address]  [place], [place]  [id number]   RE:  JET, ARMBRUST  MRN:  [id number]  /  DOB:  1941-05-30   Dear Dr. Bernestine Amass,   Mr. Roark Rufo is a very pleasant, 70 year old, white married male, 4-  1/2 years' postop CABG by Dr. Evelene Croon at which time the left internal  mammary artery was anastomosed to the LAD, the saphenous vein graft to the  diagonal, the saphenous vein graft to the first obtuse marginal, and a  saphenous vein graft sequentially to the secondary and third obtuse  marginal.   The patient has gotten along quite well and remains completely asymptomatic.   MEDICATIONS:  Include  1. Avodart 0.5 mg daily.  2. Crestor 10.  3. Aspirin 162 mg.  4. Toprol XL 50.  5. Omega-3 fish oil.  6. Lotrel/20, which was added April 21, 2006, because of mild hypertension.      His recent LDL was 44, HDL of 28.   PHYSICAL EXAMINATION:  VITAL SIGNS:  Blood pressure 125/76; pulse 60; normal  sinus rhythm.  GENERAL:  Appears normal.  NECK:  JVP is not elevated.  Carotid pulse is palpable, even, without  bruits.  LUNGS:  Clear.  CARDIAC:  Normal.  No murmur or gallop.  ABDOMEN:  Normal.  Liver, spleen, and kidney not palpable.  No masses.  EXTREMITIES:  Normal.  Pulses palpable and equal bilaterally and there is no  edema.   IMPRESSION:  1. Coronary artery disease 4-1/2 years' status post coronary artery bypass      graft - asymptomatic.  2. Hyperlipidemia, controlled.  3. Hypertension, controlled.  A stress maximum treadmill exercise      electrocardiogram was performed with the findings as follows:  The      patient exercised for 10 minutes in the Bruce protocol.  Maximum heart      rate was 157.  Blood pressure off medications increased to 203/61 at      maximum exercise.  He had no chest discomfort, shortness of breath, or  palpitations.  In the post-exercise period, he did have a few premature      ventricular contractions with 1 couplet.  There was no significant ST-T      abnormality.  IMPRESSION:  Negative maximal treadmill exercise      electrocardiogram with rare premature ventricular contraction and 1      episode of ventricular couplet.   As noted, the patient is getting along quite well and I am pleased with his  present status.  Please do not hesitate to notify us if there is any further  information desired.    Sincerely,      E. Graceann Congress, MD, [facility]   EJL/MedQ  DD:  07/13/2006  DT:  07/13/2006  Job #:  [id number]

## 2011-03-21 NOTE — Letter (Signed)
June 27, 2009     RE:  Russell [name], Russell [name]  MRN:  [id number]  /  DOB:  July 06, 1941   To Whom It May Concern:   Russell [name] was evaluated at the [facility] Cardiology office on June 21, 2009.  He is a 70 year old gentleman, who underwent coronary artery  bypass surgery in 2003.  He has been followed here since that time.  He  has remained stable without cardiac symptoms.  At his recent office  visit, he had no complaints.  He specifically denied chest pain,  dyspnea, palpitations, lightheadedness, or syncope.  He underwent an  exercise Myoview stress scan on June 18, 2009.  He had a good exercise  capacity, as he was able to exercise for 11 minutes and 31 seconds  according to the Bruce protocol, achieving 13.4 METS.  There was no  ischemia.  There was inferior wall thinning versus prior inferior  infarct.  This has been seen on the patient's previous nuclear stress  scans.  The study was not gated because of ventricular ectopy.  Therefore, I ordered a 2-D echocardiogram to assess LV function.  The  LVEF was preserved at 50-55%.   The patient's ongoing risk factors of hypertension and dyslipidemia are  under ideal control.  Please see my office note for details.   SUMMARY:  The patient remains stable from a cardiovascular standpoint  with good control of his hypertension and dyslipidemia.  He has  experienced no angina.  I will see him back in 1 year for followup with  an exercise treadmill stress test at that time.   Please feel free to call at any time if there are questions regarding  the care or condition of [name].    Sincerely,      Veverly Fells. Excell Seltzer, MD  Electronically Signed    MDC/MedQ  DD: 06/27/2009  DT: 06/28/2009  Job #: [id number]

## 2011-03-21 NOTE — H&P (Signed)
[name]. [facility]  Patient:    Russell [name], Russell [name] Visit Number: [id number] MRN: [id number]          Service Type: Attending:  Cecil Cranker, M.D. [facility] Dictated by:   Cecil Cranker, M.D. [facility] Adm. Date:  11/10/01   CC:         Stacie Glaze, M.D. 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  He presented today for screening stress Cardiolite because of a strong family history of coronary artery disease with father having had a heart attack in his 23s and a brother recently having had four-vessel percutaneous intervention.  Patient denies any prior exertional symptoms.  However, on further questioning, he has noted on a few occasions while cutting wood, that he felt a feeling of some slight discomfort in his chest.  He has had some dyspnea on exertion walking up a hill.  Today, he exercised for 8-1/2 minutes of the Bruce protocol and 93% predicted maximum heart rate.  He felt some slight chest discomfort in the mid chest. He had marked ST depression, downsloping 2 mm persisting to nine minutes; and, on the nuclear scan, he has had inferior lateral perfusion defect.  EF was 58%.  Patient having no symptoms at this time.  ALLERGIES: Dictated by:   Cecil Cranker, M.D. [facility] Attending:  Cecil Cranker, M.D. [facility] DD:  11/10/01 TD:  11/10/01 Job: [id number]

## 2011-03-21 NOTE — Op Note (Signed)
[place]. [facility]  Patient:    Russell [name], Russell [name] Visit Number: [id number] MRN: [id number]          Service Type: SUR Location: 2300 2304 01 Attending Physician:  Cleatrice Burke Dictated by:   Alleen Borne, M.D. Proc. Date: 11/17/01 Admit Date:  11/17/2001   CC:         Cecil Cranker, M.D. [facility]  Cath Lab   Operative Report  PREOPERATIVE DIAGNOSIS:  Left main and severe two-vessel coronary artery disease.  POSTOPERATIVE DIAGNOSIS:  Left main and severe two-vessel coronary artery disease.  OPERATION PERFORMED:  Median sternotomy, extracorporeal circulation, coronary artery bypass graft surgery x 5 using a left internal mammary artery graft to the left anterior descending coronary artery, with a saphenous vein graft to the diagonal branch of the left anterior descending, saphenous vein graft to the first obtuse marginal branch, and a sequential saphenous vein graft to the second and third obtuse marginal branches.  SURGEON:  Alleen Borne, M.D.  ASSISTANT: 1. Denman George, M.D. 2. [address]  ANESTHESIA:  General endotracheal.  INDICATIONS FOR PROCEDURE:  The patient is a 70 year old gentleman who was previously healthy with a strong family history of heart disease, who had a positive stress Cardiolite exam.  He has had no chest pain.  Cardiac catheterization on November 11, 2001 showed about 30% distal left main taper. There was 100% left anterior descending occlusion after the diagonal branch with filling of the LAD by collaterals.  There was 70% diagonal stenosis.  The left circumflex had about 70% stenosis between the first and second marginal branches and a 90% stenosis of the second marginal branch.  Left ventricular ejection fraction was greater than 60%.  After review of the angiograms and examination of the patient it was felt that coronary artery bypass surgery was the best treatment.  I discussed the operative  procedure with the patient and his wife and family including alternatives to surgery, benefits, and risks including bleeding, possible blood transfusion, infection, stroke, myocardial infarction, and death.  They understood and agreed to proceed with surgery.  DESCRIPTION OF PROCEDURE:  The patient was taken to the operating room and placed on the table in supine position.  After induction of general endotracheal anesthesia, a Foley catheter was placed in the bladder using sterile technique.  Then the chest, abdomen and both lower extremities were prepped and draped in the usual sterile manner.  The chest was entered through a median sternotomy incision and the pericardium opened in the midline. Examination of the heart showed good ventricular contractility.  The ascending aorta had no palpable plaques in it.  Then the left internal mammary artery was harvested from the chest wall as a pedicle graft.  This was a medium caliber vessel with excellent blood flow through it.  At the same time, a segment of greater saphenous vein was harvested from the right leg and this vein was of medium size and good quality.  Then the patient was heparinized and when an adequate activated clotting time was achieved, the distal ascending aorta was cannulated using a 20 French aortic cannula for arterial inflow.  Venous outflow was achieved using a two-stage venous cannula through the right atrial appendage.  An antegrade cardioplegia and vent cannula was inserted into the aortic root.  The patient was placed on cardiopulmonary bypass and the distal coronary arteries were identified.  The LAD was a large graftable vessel.  The diagonal branch was a large  graftable vessel.  It was diffusely diseased in its proximal and midportions but appeared free of disease distally.  The first marginal branch was intramyocardial but was a medium-sized graftable vessel. The second marginal bifurcated to a small first  branch and then a larger second branch that was graftable.  The third marginal was a large graftable vessel that communicated with the posterior descending artery off the left circumflex on angiogram.  The right coronary artery was a small nondominant vessel without minimal disease.  Then a retrograde cardioplegia cannula was inserted through the right right atrium into the coronary sinus.  Then the aorta was cross-clamped and 300 cc of cold blood antegrade cardioplegia was administered in the aortic root with quick arrest of the heart.  This was followed by 300 cc of cold blood retrograde cardioplegia. Systemic hypothermia to 20 degrees centigrade and topical hypothermia with iced saline was used.  A temperature probe was placed in the septum and an insulating pad in the pericardium.  The first distal anastomosis was performed to the second marginal branch.  The internal diameter was about 1.6.  The conduit used was a segment of greater saphenous vein.  Anastomosis was performed in a sequential side-to-side manner using continuous 7-0 Prolene suture.  The flow was measured through the graft and was excellent.  The second distal anastomosis was performed to the third marginal branch. The internal diameter was also about 1.6 mm.  The conduit used was the same segment of greater saphenous vein.  The anastomosis was performed in a sequential end-to-side manner using continuous 7-0 Prolene suture.  The flow was measured through the graft and was excellent.  Then another dose of retrograde cardioplegia was given. The third distal anastomosis was performed to the first marginal branch.  The internal diameter was 1.6 mm.  The conduit used was a second segment of greater saphenous vein.  Anastomosis was performed in an end-to-side manner using continuous 7-0 Prolene suture.  The flow was measured through the graft and was excellent.   The fourth distal anastomosis was performed to the  diagonal branch.  The internal diameter distally was about 1.6 mm.  The conduit used was a third segment of greater saphenous vein.  The anastomosis was performed in an end-to-side manner using continuous 7-0 Prolene suture.  The flow was measured through the graft and was excellent.  Then another dose of retrograde cardioplegia was given.  The fifth distal anastomosis was performed to the distal portion of the left anterior descending coronary artery.  The internal diameter was about 2 mm. The conduit used was a left internal mammary graft and this was brought through an opening in the left pericardium anterior to the phrenic nerve.  It was anastomosed to the LAD in end-to-side manner using continuous 8-0 Prolene suture.  The pedicle was tacked to the epicardium with 6-0 Prolene sutures. The patient was rewarmed to  37 degrees centigrade and the clamp removed from the mammary pedicle.  There was rapid warming of the ventricular septum and return of spontaneous sinus rhythm.  The crossclamp was removed with a time of 66 minutes.  Then a partial occlusion clamp was placed on the aortic root and the three proximal vein graft anastomoses were performed in end-to-side manner using continuous 6-0 Prolene suture.  The clamps were removed, the vein grafts deaired and the clamps removed from them.  The proximal and distal anastomoses appeared hemostatic and the line of the grafts satisfactory.  Graft markers were placed around  the proximal anastomoses.  Two temporary right ventricular and right atrial pacing wires were placed and brought out through the skin.  When the patient had rewarmed to 37 degrees centigrade, he was weaned from cardiopulmonary bypass on no inotropic agents.  Total bypass time was 132 minutes.  Cardiac function appeared excellent with cardiac output of 5L per minute.  Protamine was given and the venous and aortic cannulas were removed without difficulty.  Hemostasis was  achieved.  Four chest tubes were placed with bilateral pleural tubes, a tube in the posterior pericardium and one in the anterior mediastinum.  The pericardium was reapproximated over the heart. The sternum was closed with #6 stainless steel wires.  The fascia was closed with continuous #1 Vicryl suture.  The subcutaneous tissues were closed using continuous 2-0 Vicryl and the skin with 3-0 Vicryl subcuticular closure.  The lower extremity vein harvest site was closed in layers in a similar manner. The sponge, needle and instrument counts were correct according to the scrub nurse.  A dry sterile dressing was applied over the incisions and around the chest tubes which were hooked to Pleur-evac suction.  The patient remained hemodynamically stable and was transported to the SICU in guarded but stable condition. Dictated by:   Alleen Borne, M.D. Attending Physician:  Cleatrice Burke DD:  11/17/01 TD:  11/17/01 Job: [id number]

## 2011-03-21 NOTE — Letter (Signed)
July 09, 2006     Dr. Laurel Dimmer, Manager  Aerospace Medical Certification Division  [facility]  [address] [address]  [place], [place] [address]   RE:  LORRIN, NAWROT  MRN:  [id number]  /  DOB:  04-13-1941   Dear Dr. Venetia Maxon:   Mr. Caroline Matters is a very pleasant 70 year old white married male 4-  1/2 years postop CABG by Evelene Croon, M.D., at which time the left  internal mammary artery was anastomosed to the LAD, saphenous vein graft  to the diagonal, a saphenous graft to the first obtuse marginal and a  saphenous vein graft sequentially to the second and third obtuse  marginal.   The patient is getting along quite well and remains completely  asymptomatic.  His medications include:  1. Avodart 0.5 mg.  2. Crestor 10 mg.  3. Aspirin 162 mg.  4. Toprol XL 50 mg.  5. Omega-3 fish oil.  6. Lotrel 5/20 mg, which was added in the interim because of mild      hypertension.   We are enclosing a copy of his treadmill exercise electrocardiogram as  desired.  His recent HDL was 44, __________ 28.   PHYSICAL EXAMINATION:  VITAL SIGNS:  Examination today reveals blood  pressure 125/76, pulse of 60, normal sinus rhythm.  GENERAL APPEARANCE:  Normal.  NECK:  JVP is not elevated.  Carotid pulses are palpable and equal  without bruits.  LUNGS:  Clear.  CARDIAC:  Normal.  ABDOMEN:  Normal.  EXTREMITIES:  Normal.  Pulses are palpable and equal bilaterally.  There  is no edema.   IMPRESSION:  Coronary artery disease, 4-1/2 years status post coronary  artery bypass grafting, asymptomatic.  The patient is getting along  quite well.  His functional capacity is excellent and he has no  symptoms.  His maximal treadmill exercise electrocardiogram is normal.   Please do not hesitate to notify us if there is any further information  desired.   ENCLOSURE:  Copy of treadmill exercise electrocardiogram.    Sincerely,      E. Graceann Congress,  MD, [facility]   EJL/MedQ  DD:  07/09/2006  DT:  07/09/2006  Job #:  [id number]

## 2011-03-21 NOTE — Discharge Summary (Signed)
[place]. [facility]  Patient:    Russell [name], Russell [name] Visit Number: [id number] MRN: [id number]          Service Type: SUR Location: 2000 2005 01 Attending Physician:  Cleatrice Burke Dictated by:   Adair Patter, P.A. Admit Date:  11/17/2001 Disc. Date: 11/23/01   CC:         CVTS Office  E. Graceann Congress, M.D. [facility]   Discharge Summary  DATE OF BIRTH:  [birth year].  ADMISSION DIAGNOSIS:  Coronary artery disease.  SECONDARY DIAGNOSES: 1. Hyperlipidemia. 2. Postoperative anemia secondary to blood loss.  DISCHARGE DIAGNOSIS:  Coronary artery disease.  HOSPITAL PROCEDURE:  Coronary artery bypass grafting times five.  HOSPITAL COURSE:  The patient was admitted to the hospital on 11/17/01 secondary to coronary artery disease.  The patient has had an abnormal Cardiolite test performed as an outpatient.  Because of this he underwent cardiac catheterization which revealed the patient had significant coronary artery disease amenable to surgical correction.  He was discharged home in stable condition and was readmitted to [facility] on 11/17/01 on which date Dr. Laneta Simmers performed a coronary artery bypass grafting times five at the left ventricular mammary artery, anastomosed to the left anterior descending artery, saphenous vein graft to diagonal artery, saphenous vein graft to the first obtuse marginal artery and sequential saphenous vein graft to the second and third obtuse marginal arteries.  No complications were noted during the procedure.  Postoperatively the patient had a relatively uneventful hospital course.  His course was somewhat slowed down by anemia secondary to blood loss.  This was treated with iron supplementation and Epogen injections. Despite these interventions the patients hemoglobin and hematocrit failed to rise to acceptable levels.  Because of this the patient was transfused one unit packed red blood cells.  The patients final  hemoglobin and hematocrit were not available at the time of this dictation.  He was subsequently discharged home in stable condition on 11/23/01.  MEDICATIONS AT TIME OF DISCHARGE: 1. Tylox one to two tablets q.4-6h as needed for pain. 2. Aspirin 325 mg one daily. 3. Toprol XL 25 mg one daily. 4. Niferex 150, one tablet twice daily. 5. Colace 100 mg one tablet twice daily.  ACTIVITY: Patient is told to avoid driving, strenuous activity, no lifting of objects over 10 pounds.  He was told to walk daily and continue to use his incentive spirometer ten times every hour.  DIET:  Low fat, low salt.  WOUND CARE:  Patient told he could shower and clean his incision with soap and water.  DISPOSITION:  Home.  FOLLOW UP:  Patient was told he had an appointment with his Cardiologist, Dr. Graceann Congress on January 31st at 11:15 a.m.  His follow up appointment with Dr. Laneta Simmers at the CVTS office will be on Tuesday February 11th at 9:00 a.m. He is told to bring his chest x-ray to this appointment. Dictated by:   Adair Patter, P.A. Attending Physician:  Cleatrice Burke DD:  11/22/01 TD:  11/23/01 Job: [id number]

## 2011-03-21 NOTE — Op Note (Signed)
NAMEEDISON, Russell [name]                          ACCOUNT NO.:  [id number]   MEDICAL RECORD NO.:  [id number]                   PATIENT TYPE:  AMB   LOCATION:  DSC                                  FACILITY:  [facility]   PHYSICIAN:  Velora Heckler, M.D.                DATE OF BIRTH:  1941-08-27   DATE OF PROCEDURE:  12/23/2002  DATE OF DISCHARGE:                                 OPERATIVE REPORT   PREOPERATIVE DIAGNOSIS:  Reducible left inguinal hernia.   POSTOPERATIVE DIAGNOSIS:  Reducible left inguinal hernia.   OPERATION:  Repair of left inguinal hernia with Prolene mesh.   SURGEON:  Velora Heckler, M.D.   ANESTHESIA:  General   ESTIMATED BLOOD LOSS:  Minimal   PREPARATION:  Betadine   COMPLICATIONS:  None.   INDICATIONS FOR PROCEDURE:  The patient is a pleasant 70 year old gentleman  from [place], [place] who presents with left inguinal hernia.  This has been present for several months.  It causes him mild discomfort.  The patient was evaluated by Dr. Darryll Capers, his primary care physician,  who diagnosed the hernia.  The patient now comes to surgery for repair.   DESCRIPTION OF PROCEDURE:  The procedure was done in operating room #1 at  the [facility].  The patient is placed in a supine position on  the operating room table.  Following the administration of general  anesthesia, the patient is prepped and draped in the usual strict aseptic  fashion.  After ascertaining that an adequate level of anesthesia had been  obtained, a left inguinal incision was made with a #15 blade.  Dissection  was carried down through the subcutaneous tissues and hemostasis obtained  with the electrocautery.  External oblique fascia was incised in line with  its fibers and extended through the external inguinal ring.  Spermatic cord  and ilioinguinal nerve are encircled with a Penrose drain.  Margins of the  field are dissected out.  There is a direct inguinal hernia sac  which tracks  along the cord structures.  It is dissected away from the cord structures up  to the level of the internal inguinal ring.  Hernia sac is inverted and then  held in reduction with interrupted 3-0 Vicryl sutures.  Next, the floor of  the inguinal canal is recreated with a sheet of Prolene mesh.  Mesh is cut  to the appropriate dimensions.  It is secured to the pubic tubercle and  along the inguinal ligament with a running 2-0 Novofil suture.  Mesh is  split to accommodate the cord structures.  Superior margin of the mesh is  secured to the transversalis and internal oblique fascia with interrupted 2-  0 Novofil sutures.  Tails of the mesh are overlapped lateral to the cord  structures and the inferior edges of the tails of the mesh are secured to  the inguinal  ligament with an interrupted 2-0 Novofil suture.  Local field  block is placed with Marcaine.  Good hemostasis is noted.  Cord structures  and ilioinguinal nerve are returned to the inguinal canal.  External oblique  fascia is closed with interrupted 3-0 Vicryl sutures.  Subcutaneous tissues  are reapproximated with interrupted 3-0 Vicryl sutures.  Skin edges are  anesthetized with local Marcaine  anesthetic.  The skin edges are reapproximated with interrupted 4-0 Vicryl  subcuticular sutures.  Wound is washed and dried and Benzoin and Steri-  Strips are applied.  Sterile gauze dressings are applied.  The patient is  awakened from anesthesia and brought to the recovery room in stable  condition.  The patient tolerated the procedure well.                                               Velora Heckler, M.D.    TMG/MEDQ  D:  12/23/2002  T:  12/23/2002  Job:  [id number]   cc:   Stacie Glaze, M.D. [facility]

## 2011-03-21 NOTE — Discharge Summary (Signed)
[place]. [facility]  Patient:    YSIDRO, RAMSAY Visit Number: [id number] MRN: [id number]          Service Type: SUR Location: 2000 2005 01 Attending Physician:  Cleatrice Burke Dictated by:   Adair Patter, P.A. Admit Date:  11/17/2001 Discharge Date: 11/23/2001   CC:         CVTS office  [facility]   Discharge Summary  DATE OF BIRTH:  Feb 11, 1941  ADMIT DIAGNOSES:  Chest pain.  DISCHARGE DIAGNOSES:  Coronary artery disease.  HOSPITAL COURSE:  Mr. Ledin was admitted to [facility] on November 12, 2001 secondary to experiencing chest pain.  He underwent cardiac catheterization because of this problem which revealed he had surgically correctable coronary artery disease.  On November 17, 2001 Dr. Laneta Simmers performed coronary bypass graft x5 with left internal mammary artery anastomosis to the left anterior descending artery, saphenous vein graft to the diagonal, saphenous vein graft to the intermediate artery, and sequential saphenous vein graft to the obtuse marginals 2 and 3.  No complications noted during procedure.  Postoperatively patient had a relatively uneventful hospital course.  He was found to have anemia with a hemoglobin and hematocrit of 7.4 and 21.8.  Because of this patient was transfused 1 unit of packed red blood cells.  His hemoglobin had risen to 8.3 and hematocrit 23.9 at time of discharge.  He was subsequently deemed stable for discharge on postoperative day #6 November 23, 2001.  DISCHARGE MEDICATIONS: 1. Tylox one to two tablets q.4-6h. p.r.n. for pain. 2. Aspirin 325 mg one daily. 3. Toprol XL 25 mg one daily. 4. Niferex 150 one daily. 5. Colace 100 mg one tablet b.i.d. 6. Lasix 40 mg daily for one week. 7. Potassium chloride 20 mEq one daily for one week. 8. Folic acid 1 mg one daily. 9. Thorazine 25 mg one tablet at bedtime p.r.n.  ACTIVITY:  Patient told to avoid driving, strenuous activity,  lifting heavy objects.  DISCHARGE DIET:  Low fat, low salt.  WOUND CARE:  Patient was told he could clean his incisions with soap and water.  DISPOSITION:  Home.  FOLLOWUP:  Patient will see Dr. Laneta Simmers Tuesday, February 11 at 9:00 a.m. at CVTS office.  He is told to see Dr. Corinda Gubler on January 31 at 11:15 a.m.  He is told to bring chest x-ray to Dr. Garen Grams office. Dictated by:   Adair Patter, P.A. Attending Physician:  Cleatrice Burke DD:  01/11/02 TD:  01/13/02 Job: [id number]

## 2011-03-21 NOTE — Consult Note (Signed)
[place]. [facility]  Patient:    Russell [name], Russell [name] Visit Number: [id number] MRN: [id number]          Service Type: MED Location: 3700 [phone] Attending Physician:  Glennon Hamilton Dictated by:   Alleen Borne, M.D. Proc. Date: 11/11/01 Admit Date:  11/10/2001 Discharge Date: 11/12/2001   CC:         Veneda Melter, M.D. [facility]   Consultation Report  REFERRING PHYSICIAN:  Veneda Melter, M.D.  REASON FOR CONSULTATION:  Severe two-vessel coronary artery disease with positive stress Cardiolite exam.  HISTORY OF PRESENT ILLNESS:  This patient is a 70 year old gentleman who was in previous good health who has a very strong positive family history of heart disease.  His father had a heart attack in his 62s and his brother recently underwent stenting of several arteries in [place].  Because of this, the patient presented for cardiac evaluation.  He had a mildly abnormal C-reactive protein.  He underwent a stress Cardiolite exam, which showed marked downsloping ST depression, as well as producing some mild chest discomfort. Cardiolite images revealed inferolateral ischemia with an ejection fraction of 58%.  He underwent a cardiac catheterization today, which showed distal left main taper of about 30%.  The LAD was occluded in its mid portion with faint filling of the collaterals.  The first diagonal had 70% stenosis.  The left circumflex had three obtuse marginal branches and a posterior descending branch.  There was 70% stenosis between the first and second marginal branches and a 90% second marginal stenosis.  The right coronary artery was a small nondominant vessel without significant disease.  The left ventricular ejection fraction was greater than 60% with no mitral regurgitation.  MEDICATIONS PRIOR TO ADMISSION:  Aspirin 81 mg q.d.  PAST MEDICAL HISTORY:  Significant for back surgery at age 79.  REVIEW OF SYSTEMS:  CONSTITUTIONAL:  He denies fever or  chills.  He has had no weight changes.  He has had some fatigue.  EYES:  Negative.  ENT:  Negative. ENDOCRINE:  He denies diabetes and hypothyroidism.  CARDIOVASCULAR:  As above. He has had no chest pain at home.  He has had some fatigue but no shortness of breath.  He has had no PND or orthopnea.  He denies peripheral edema and palpitations.  RESPIRATORY:  He denies cough and sputum production.  GI:  He has had no nausea or vomiting.  He denies melena and bright red blood per rectum.  GU:  He denies dysuria and hematuria.  NEUROLOGIC:  He has had no focal weakness or numbness.  He denies dizziness and syncope.  He has never had a stroke or a TIA.  ALLERGIES:  PENICILLIN.  HEMATOLOGIC:  He has had no history of bleeding disorders or easy bleeding.  PSYCHIATRIC:  Negative.  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  He denies tobacco or alcohol abuse.  He is married with two children.  FAMILY HISTORY:  His father died at age 67 of a suicide after having a myocardial infarction.  His brother is 43 and recently had stents placed.  PHYSICAL EXAMINATION:  VITAL SIGNS:  Blood pressure 120/80 with a pulse of 65 and regular. Respiratory rate is 18 and unlabored.  GENERAL:  He is a well-developed white male in no distress.  HEENT:  Normocephalic and atraumatic.  The pupils are equal and reactive to light and accommodation.  Extraocular muscles are intact.  His throat is clear.  NECK:  Normal carotid pulses bilaterally.  There are no bruits.  There is no adenopathy or thyromegaly.  CARDIAC:  Regular rate and rhythm with normal S1 and S2.  There is no murmur, rub, or gallop.  LUNGS:  Clear.  ABDOMEN:  Active bowel sounds.  His abdomen is soft, flat, and nontender. There are no palpable masses or organomegaly.  EXTREMITIES:  No peripheral edema.  Pedal pulses are palpable bilaterally.  NEUROLOGIC:  Alert and oriented x 3.  Motor and sensory exams are  grossly normal.  SKIN:  Warm and dry.  LABORATORY DATA:  Carotid Doppler examination shows no evidence of internal carotid artery stenosis.  His electrolytes are normal with a BUN of 21 and a creatinine of 1.3.  Electrocardiogram shows sinus rhythm with occasional PVCs.  IMPRESSION:  This patient has severe two-vessel coronary artery disease with ischemia by Cardiolite scan.  He has essentially no symptoms.  I agree that, at his age of 4 years and with an occluded left anterior descending, his best long-term prognosis will be with coronary artery bypass graft surgery.  I have discussed the operative procedure with he and his wife and his brother including alternatives, benefits, and risks including bleeding, possible blood transfusion, infection, stroke, myocardial infarction, and death.  They understand and would like to proceed with surgery.  We will plan to do it next Wednesday.  He should be able to go home on a leave of absence until then. Dictated by:   Alleen Borne, M.D. Attending Physician:  Glennon Hamilton DD:  11/11/01 TD:  11/13/01 Job: [id number]

## 2011-04-04 HISTORY — PX: BASAL CELL CARCINOMA EXCISION: SHX1214

## 2011-04-08 ENCOUNTER — Telehealth: Payer: Self-pay | Admitting: Cardiovascular Disease

## 2011-04-08 DIAGNOSIS — I2510 Atherosclerotic heart disease of native coronary artery without angina pectoris: Secondary | ICD-10-CM

## 2011-04-08 DIAGNOSIS — I10 Essential (primary) hypertension: Secondary | ICD-10-CM

## 2011-04-08 DIAGNOSIS — E785 Hyperlipidemia, unspecified: Secondary | ICD-10-CM

## 2011-04-08 NOTE — Telephone Encounter (Signed)
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 Last year pt had a GXT  the first week in September. According to patient this year he needs a stress Myoview. A Stress test order Placed for pt to be done the first week in September 2012. Patient aware that he will be call with date and time.

## 2011-04-08 NOTE — Telephone Encounter (Signed)
Per pt calling would like to be set up for stress test.

## 2011-04-08 NOTE — Telephone Encounter (Signed)
Lab work added as requested per pt. BMP, LFT, Lipid Panel and TSH.

## 2011-04-16 ENCOUNTER — Other Ambulatory Visit: Payer: Self-pay

## 2011-05-01 ENCOUNTER — Other Ambulatory Visit: Payer: Self-pay | Admitting: Internal Medicine

## 2011-06-11 ENCOUNTER — Encounter: Payer: Self-pay | Admitting: Cardiovascular Disease

## 2011-07-08 ENCOUNTER — Ambulatory Visit (HOSPITAL_COMMUNITY): Payer: Medicare Other | Attending: Cardiovascular Disease | Admitting: Radiology

## 2011-07-08 ENCOUNTER — Other Ambulatory Visit (INDEPENDENT_AMBULATORY_CARE_PROVIDER_SITE_OTHER): Payer: Medicare Other | Admitting: *Deleted

## 2011-07-08 VITALS — Ht 72.0 in | Wt 184.0 lb

## 2011-07-08 DIAGNOSIS — E785 Hyperlipidemia, unspecified: Secondary | ICD-10-CM

## 2011-07-08 DIAGNOSIS — I2510 Atherosclerotic heart disease of native coronary artery without angina pectoris: Secondary | ICD-10-CM

## 2011-07-08 DIAGNOSIS — I25810 Atherosclerosis of coronary artery bypass graft(s) without angina pectoris: Secondary | ICD-10-CM

## 2011-07-08 DIAGNOSIS — I4949 Other premature depolarization: Secondary | ICD-10-CM

## 2011-07-08 DIAGNOSIS — I10 Essential (primary) hypertension: Secondary | ICD-10-CM

## 2011-07-08 LAB — TSH: TSH: 1.51 u[IU]/mL (ref 0.35–5.50)

## 2011-07-08 LAB — LIPID PANEL
HDL: 32.7 mg/dL — ABNORMAL LOW (ref >39.00)
Total CHOL/HDL Ratio: 3
VLDL: 12.4 mg/dL (ref 0.0–40.0)

## 2011-07-08 LAB — BASIC METABOLIC PANEL
BUN: 29 mg/dL — ABNORMAL HIGH (ref 6–23)
CO2: 31 mEq/L (ref 19–32)
Calcium: 8.9 mg/dL (ref 8.4–10.5)
Glucose, Bld: 99 mg/dL (ref 70–99)
Potassium: 4.5 mEq/L (ref 3.5–5.1)
Sodium: 144 mEq/L (ref 135–145)

## 2011-07-08 LAB — HEPATIC FUNCTION PANEL: Total Bilirubin: 0.6 mg/dL (ref 0.3–1.2)

## 2011-07-08 MED ORDER — TECHNETIUM TC 99M TETROFOSMIN IV KIT
33.0000 | PACK | Freq: Once | INTRAVENOUS | Status: AC | PRN
  Administered 2011-07-08: 33 via INTRAVENOUS

## 2011-07-08 MED ORDER — TECHNETIUM TC 99M TETROFOSMIN IV KIT
10.7000 | PACK | Freq: Once | INTRAVENOUS | Status: AC | PRN
  Administered 2011-07-08: 11 via INTRAVENOUS

## 2011-07-08 NOTE — Progress Notes (Signed)
[facility] SITE 3 NUCLEAR MED [address] [place] [address] [phone]  Cardiology Nuclear Med Study  MARQUI FORMBY is a 70 y.o. male [id number] [birth year]   Nuclear Med Background Indication for Stress Test:  Evaluation for Ischemia and Graft Patency History: '03 CABGx5, 08/10 Echo: EF 50-55%, 2011 GXT: NL, '03 Heart Catheterization and 08/10 Myocardial Perfusion Study: NL Not Gated Cardiac Risk Factors: Family History - CAD, Hypertension and Lipids  Symptoms:  Palpitations   Nuclear Pre-Procedure Caffeine/Decaff Intake:  None NPO After: 8:00pm   Lungs:  clear IV 0.9% NS with Angio Cath:  20g  IV Site: R Antecubital  IV Started by:  Stanton Kidney, EMT-P  Chest Size (in):  42 Cup Size: n/a  Height: 6' (1.829 m)  Weight:  184 lb (83.462 kg)  BMI:  Body mass index is 24.95 kg/(m^2). Tech Comments:  Atenolol held > 70 hours, per patient.   This patient followed the protocol for the [facility] flight requirements.    Nuclear Med Study 1 or 2 day study: 1 day  Stress Test Type:  Stress  Reading MD: Russell Rough, MD  Order Authorizing Provider:  M.Cooper  Resting Radionuclide: Technetium 34m Tetrofosmin  Resting Radionuclide Dose: 10.7 mCi   Stress Radionuclide:  Technetium 23m Tetrofosmin  Stress Radionuclide Dose: 33 mCi           Stress Protocol Rest HR: 56 Stress HR: 157  Rest BP: 143/70 Stress BP: 187/63  Exercise Time (min): 11:01 METS: 13.4   Predicted Max HR: 151 bpm % Max HR: 103.97 bpm Rate Pressure Product: 46962   Dose of Adenosine (mg):  n/a Dose of Lexiscan: n/a mg  Dose of Atropine (mg): n/a Dose of Dobutamine: n/a mcg/kg/min (at max HR)  Stress Test Technologist: Milana Na, EMT-P  Nuclear Technologist:  Doyne Keel, CNMT     Rest Procedure:  Myocardial perfusion imaging was performed at rest 45 minutes following the intravenous administration of Technetium 69m Tetrofosmin. Rest ECG: Sinus Bradycardia  Stress Procedure:  The  patient exercised for 11:01.  The patient stopped due to total heart rate reached and denied any chest pain.  There were no significant ST-T wave changes.  Technetium 57m Tetrofosmin was injected at peak exercise and myocardial perfusion imaging was performed after a brief delay. Stress ECG: No significant change from baseline ECG  QPS Raw Data Images:  Normal; no motion artifact; normal heart/lung ratio. Stress Images:  Mildly decreased activity in the inferior wall. Rest Images:  Same as stress, Subtraction (SDS):  No evidence of ischemia. Transient Ischemic Dilatation (Normal <1.22):  .84 Lung/Heart Ratio (Normal <0.45):  .24  Quantitative Gated Spect Images QGS EDV:  119 ml QGS ESV:  48 ml QGS cine images:  Very mild relative decrease in motion of the inferior wall. QGS EF: 59%  Impression Exercise Capacity:  Excellent exercise capacity. BP Response:  Normal blood pressure response. Clinical Symptoms:  No chest pain. ECG Impression:  No significant ST segment change suggestive of ischemia. Comparison with Prior Nuclear Study: No significant change from previous study  Overall Impression:  There is no ischemia. There may be mild scar in the inferior wall.  Russell [name]

## 2011-07-10 ENCOUNTER — Encounter: Payer: Self-pay | Admitting: Cardiovascular Disease

## 2011-07-10 ENCOUNTER — Ambulatory Visit (INDEPENDENT_AMBULATORY_CARE_PROVIDER_SITE_OTHER): Payer: Medicare Other | Admitting: Cardiovascular Disease

## 2011-07-10 VITALS — BP 118/76 | HR 68 | Ht 72.0 in | Wt 182.1 lb

## 2011-07-10 DIAGNOSIS — E782 Mixed hyperlipidemia: Secondary | ICD-10-CM

## 2011-07-10 DIAGNOSIS — I25810 Atherosclerosis of coronary artery bypass graft(s) without angina pectoris: Secondary | ICD-10-CM

## 2011-07-10 DIAGNOSIS — I10 Essential (primary) hypertension: Secondary | ICD-10-CM

## 2011-07-10 DIAGNOSIS — I2510 Atherosclerotic heart disease of native coronary artery without angina pectoris: Secondary | ICD-10-CM

## 2011-07-10 NOTE — Assessment & Plan Note (Signed)
Labs were reviewed and LDL is at goal of less than 70 mg per deciliter. The patient's HDL remains low but it is improved from past readings. The patient is on statin therapy with Crestor.

## 2011-07-10 NOTE — Patient Instructions (Signed)
Your physician has requested that you have an exercise tolerance test. For further information please visit [email or web address] Please also follow instruction sheet, as given. To be done in Sept. 2013

## 2011-07-10 NOTE — Assessment & Plan Note (Signed)
Blood pressure remains well controlled on a combination of atenolol and Azor.

## 2011-07-10 NOTE — Assessment & Plan Note (Signed)
The patient is stable without angina. His risk factors are well controlled. His recent nuclear scan results were reviewed and this demonstrates no ischemia. There is a mild fixed defect which is been seen on all of his past at least. Recommend continued medical management. 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 I see no changes in his clinical status from previous years.

## 2011-07-10 NOTE — Progress Notes (Signed)
HPI:  This is a 70 year old gentleman with coronary artery disease and previous bypass surgery in 2003. He presents today for followup evaluation. 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 He has recently completed his exercise Myoview scan which demonstrated no significant ischemia. He has good exercise tolerance and he completed over 11 minutes according to the Bruce protocol.  He has had a mild fixed defect in the inferior wall which is unchanged over several studies.  The patient is doing well from a symptomatic standpoint. He denies chest pain or pressure. He denies dyspnea, orthopnea, PND, palpitations, lightheadedness, syncope, or edema. He is engaged in an exercise program with regular walking and he has no symptoms with that level of exertion. He reports no recent changes in his medical program.  Outpatient Encounter Prescriptions as of 07/10/2011  Medication Sig Dispense Refill  . aspirin 81 MG tablet Take 81 mg by mouth daily.        [name] Kitchen atenolol (TENORMIN) 25 MG tablet Take 25 mg by mouth daily.        . AZOR 10-40 MG per tablet TAKE (1/2) TABLET TWICE DAILY.  30 each  6  . CRESTOR 10 MG tablet TAKE (1) TABLET DAILY  30 each  11  . dutasteride (AVODART) 0.5 MG capsule Take 0.5 mg by mouth daily.        . fish oil-omega-3 fatty acids 1000 MG capsule Take 1 g by mouth daily.        . Multiple Vitamins-Minerals (OCUVITE ADULT FORMULA PO) Take 1 tablet by mouth daily.        [name] Kitchen DISCONTD: Multiple Vitamins-Minerals (MULTIVITAMIN WITH MINERALS) tablet Take 1 tablet by mouth daily.          Allergies  Allergen Reactions  . Penicillins     Past Medical History  Diagnosis Date  . Hypertension   . Hyperlipidemia   . Ulcer   . CAD (coronary artery disease)     ROS: Negative except as per HPI  BP 118/76  Pulse 68  Ht 6' (1.829 m)  Wt 182 lb 1.9 oz (82.609 kg)  BMI 24.70 kg/m2  PHYSICAL EXAM: Pt is  alert and oriented, NAD HEENT: normal Neck: JVP - normal, carotids 2+= without bruits Lungs: CTA bilaterally CV: RRR without murmur or gallop Abd: soft, NT, Positive BS, no hepatomegaly Ext: no C/C/E, distal pulses intact and equal Skin: warm/dry no rash  ASSESSMENT AND PLAN:  \

## 2011-07-21 ENCOUNTER — Ambulatory Visit: Payer: PRIVATE HEALTH INSURANCE | Admitting: Internal Medicine

## 2011-07-30 ENCOUNTER — Ambulatory Visit (INDEPENDENT_AMBULATORY_CARE_PROVIDER_SITE_OTHER): Payer: Medicare Other | Admitting: Internal Medicine

## 2011-07-30 DIAGNOSIS — Z23 Encounter for immunization: Secondary | ICD-10-CM

## 2011-09-29 ENCOUNTER — Ambulatory Visit (INDEPENDENT_AMBULATORY_CARE_PROVIDER_SITE_OTHER): Payer: Medicare Other | Admitting: Internal Medicine

## 2011-09-29 ENCOUNTER — Encounter: Payer: Self-pay | Admitting: Internal Medicine

## 2011-09-29 VITALS — BP 128/77 | HR 55 | Temp 98.1°F | Resp 12 | Ht 73.0 in | Wt 188.6 lb

## 2011-09-29 DIAGNOSIS — Z0000 Encounter for general adult medical examination without abnormal findings: Secondary | ICD-10-CM

## 2011-09-29 LAB — POCT URINALYSIS DIPSTICK
Bilirubin, UA: NEGATIVE
Blood, UA: NEGATIVE
Glucose, UA: NEGATIVE
Leukocytes, UA: NEGATIVE
Nitrite, UA: NEGATIVE
Urobilinogen, UA: 0.2
pH, UA: 6.5

## 2011-09-29 NOTE — Patient Instructions (Signed)
Preventive Health Care: Exercise at least 30-45 minutes a day,  3-4 days a week.  Eat a low-fat diet with lots of fruits and vegetables, up to 7-9 servings per day. Consume less than 40 grams of sugar per day from foods & drinks with High Fructose Corn Sugar as # 1,2,3 or # 4 on label.  

## 2011-09-29 NOTE — Progress Notes (Signed)
Subjective:    Patient ID: Russell [name], male    DOB: 1940-12-01, 70 y.o.   MRN: [id number]  HPI  Russell [name]  is here for a physical; he denies acute issues .    Review of Systems Patient reports no  vision/ hearing changes,anorexia, weight change, fever ,adenopathy, persistant / recurrent hoarseness, swallowing issues, chest pain,palpitations, edema,persistant / recurrent cough, hemoptysis, dyspnea(rest, exertional, paroxysmal nocturnal), gastrointestinal  bleeding (melena, rectal bleeding), abdominal pain, excessive heart burn, GU symptoms( dysuria, hematuria, pyuria, voiding/incontinence  issues) syncope, focal weakness, memory loss,numbness & tingling, skin/hair/nail changes,depression, anxiety, abnormal bruising/bleeding,or  musculoskeletal symptoms/signs.      Objective:   Physical Exam Gen.: Thin but healthy and well-nourished in appearance. Alert, appropriate and cooperative throughout exam. Appears younger than age Head: Normocephalic without obvious abnormalities;  no alopecia  Eyes: No corneal or conjunctival inflammation noted. Pupils equal round reactive to light and accommodation. Fundal exam is benign without hemorrhages, exudate, papilledema. Extraocular motion intact. Vision grossly normal with lenses. Ears: External  ear exam reveals no significant lesions or deformities. Canals clear .TMs normal. Hearing is grossly normal bilaterally. Nose: External nasal exam reveals no deformity or inflammation. Nasal mucosa are pink and moist. No lesions or exudates noted. Septum  Dislocated to L  Mouth: Oral mucosa and oropharynx reveal no lesions or exudates. Teeth in good repair. Neck: No deformities, masses, or tenderness noted. Range of motion &  Thyroid  normal. Lungs: Normal respiratory effort; chest expands symmetrically. Lungs are clear to auscultation without rales, wheezes, or increased work of breathing. Heart: Normal rate and rhythm. Normal S1 and S2. No gallop, click, or  rub. No  murmur. Abdomen: Bowel sounds normal; abdomen soft and nontender. No masses, organomegaly or hernias noted. Genitalia: Varicoele on L; anus visably normal    .                                                                                   Musculoskeletal/extremities: No deformity or scoliosis noted of  the thoracic or lumbar spine. No clubbing, cyanosis, edema, or deformity noted. Range of motion  normal .Tone & strength  normal.Joints normal. Nail health  good. Vascular: Carotid, radial artery, dorsalis pedis and  posterior tibial pulses are full and equal. No bruits present. Neurologic: Alert and oriented x3. Deep tendon reflexes symmetrical and normal.          Skin: Intact without suspicious lesions or rashes. Scar L palm Lymph: No cervical, axillary, or inguinal lymphadenopathy present. Psych: Mood and affect are normal. Normally interactive                                                                                         Assessment & Plan:  #1 comprehensive physical exam; no acute findings #2 see Problem List with Assessments & Recommendations Plan:  see Orders

## 2011-10-31 ENCOUNTER — Other Ambulatory Visit: Payer: Self-pay | Admitting: Cardiovascular Disease

## 2011-11-17 ENCOUNTER — Encounter: Payer: Self-pay | Admitting: Gastroenterology

## 2011-12-08 ENCOUNTER — Telehealth: Payer: Self-pay | Admitting: *Deleted

## 2011-12-08 MED ORDER — AMLODIPINE-OLMESARTAN 10-40 MG PO TABS: 10.0000 | ORAL_TABLET | Freq: Every day | ORAL | Status: DC

## 2011-12-08 NOTE — Telephone Encounter (Signed)
Done

## 2011-12-16 ENCOUNTER — Ambulatory Visit (AMBULATORY_SURGERY_CENTER): Payer: Medicare Other | Admitting: *Deleted

## 2011-12-16 VITALS — Ht 71.0 in | Wt 188.0 lb

## 2011-12-16 DIAGNOSIS — Z1211 Encounter for screening for malignant neoplasm of colon: Secondary | ICD-10-CM

## 2011-12-16 MED ORDER — PEG-KCL-NACL-NASULF-NA ASC-C 100 G PO SOLR: ORAL | Status: DC

## 2011-12-30 ENCOUNTER — Encounter: Payer: Self-pay | Admitting: Gastroenterology

## 2011-12-30 ENCOUNTER — Ambulatory Visit (AMBULATORY_SURGERY_CENTER): Payer: Medicare Other | Admitting: Gastroenterology

## 2011-12-30 DIAGNOSIS — Z86010 Personal history of colonic polyps: Secondary | ICD-10-CM

## 2011-12-30 DIAGNOSIS — Z1211 Encounter for screening for malignant neoplasm of colon: Secondary | ICD-10-CM

## 2011-12-30 MED ORDER — SODIUM CHLORIDE 0.9 % IV SOLN: 500.0000 mL | INTRAVENOUS | Status: DC

## 2011-12-30 NOTE — Progress Notes (Signed)
Patient did not experience any of the following events: a burn prior to discharge; a fall within the facility; wrong site/side/patient/procedure/implant event; or a hospital transfer or hospital admission upon discharge from the facility. (G8907) Patient did not have preoperative order for IV antibiotic SSI prophylaxis. (G8918)  

## 2011-12-30 NOTE — Op Note (Signed)
[facility] [address] [place], [place]  [address]  COLONOSCOPY PROCEDURE REPORT  PATIENT:  Russell [name], Russell [name]  MR#:  [id number] BIRTHDATE:  1940-12-18, 70 yrs. old  GENDER:  male ENDOSCOPIST:  Judie Petit T. Russella Dar, MD, [facility]  PROCEDURE DATE:  12/30/2011 PROCEDURE:  Higher-risk screening colonoscopy G0105 ASA CLASS:  Class II INDICATIONS:  1) surveillance and high-risk screening  2) history of pre-cancerous (adenomatous) colon polyps: 2005 MEDICATIONS:   These medications were titrated to patient response per physician's verbal order, Fentanyl 75 mcg IV, Versed 8 mg IV DESCRIPTION OF PROCEDURE:   After the risks benefits and alternatives of the procedure were thoroughly explained, informed consent was obtained.  Digital rectal exam was performed and revealed no abnormalities.   The LB [id number] endoscope was introduced through the anus and advanced to the cecum, which was identified by both the appendix and ileocecal valve, without limitations.  The quality of the prep was good, using MoviPrep. The instrument was then slowly withdrawn as the colon was fully examined. <<PROCEDUREIMAGES>> FINDINGS:  Mild diverticulosis was found in the sigmoid colon. Otherwise normal colonoscopy without other polyps, masses, vascular ectasias, or inflammatory changes.   Retroflexed views in the rectum revealed internal hemorrhoids, small.  The time to cecum = 2.67  minutes. The scope was then withdrawn (time =  11.25  min) from the patient and the procedure completed.  COMPLICATIONS:  None  ENDOSCOPIC IMPRESSION: 1) Mild diverticulosis in the sigmoid colon 2) Internal hemorrhoids  RECOMMENDATIONS: 1) High fiber diet with liberal fluid intake. 2) Repeat Colonoscopy in 5 years.  Venita Lick. Russella Dar, MD, Russell [name]  n. eSIGNED:   Venita Lick. Pasquale Matters at 12/30/2011 01:55 PM  Hal Hope, [phone]

## 2011-12-30 NOTE — Patient Instructions (Signed)
Teaching materials given on diverticulosis  YOU HAD AN ENDOSCOPIC PROCEDURE TODAY AT THE [facility]: Refer to the procedure report that was given to you for any specific questions about what was found during the examination.  If the procedure report does not answer your questions, please call your gastroenterologist to clarify.  If you requested that your care partner not be given the details of your procedure findings, then the procedure report has been included in a sealed envelope for you to review at your convenience later.  YOU SHOULD EXPECT: Some feelings of bloating in the abdomen. Passage of more gas than usual.  Walking can help get rid of the air that was put into your GI tract during the procedure and reduce the bloating. If you had a lower endoscopy (such as a colonoscopy or flexible sigmoidoscopy) you may notice spotting of blood in your stool or on the toilet paper. If you underwent a bowel prep for your procedure, then you may not have a normal bowel movement for a few days.  DIET: Your first meal following the procedure should be a light meal and then it is ok to progress to your normal diet.  A half-sandwich or bowl of soup is an example of a good first meal.  Heavy or fried foods are harder to digest and may make you feel nauseous or bloated.  Likewise meals heavy in dairy and vegetables can cause extra gas to form and this can also increase the bloating.  Drink plenty of fluids but you should avoid alcoholic beverages for 24 hours.  ACTIVITY: Your care partner should take you home directly after the procedure.  You should plan to take it easy, moving slowly for the rest of the day.  You can resume normal activity the day after the procedure however you should NOT DRIVE or use heavy machinery for 24 hours (because of the sedation medicines used during the test).    SYMPTOMS TO REPORT IMMEDIATELY: A gastroenterologist can be reached at any hour.  During normal business  hours, 8:30 AM to 5:00 PM Monday through Friday, call [phone].  After hours and on weekends, please call the GI answering service at [phone] who will take a message and have the physician on call contact you.   Following lower endoscopy (colonoscopy or flexible sigmoidoscopy):  Excessive amounts of blood in the stool  Significant tenderness or worsening of abdominal pains  Swelling of the abdomen that is new, acute  Fever of 100F or higher  Following upper endoscopy (EGD)  Vomiting of blood or coffee ground material  New chest pain or pain under the shoulder blades  Painful or persistently difficult swallowing  New shortness of breath  Fever of 100F or higher  Black, tarry-looking stools  FOLLOW UP: If any biopsies were taken you will be contacted by phone or by letter within the next 1-3 weeks.  Call your gastroenterologist if you have not heard about the biopsies in 3 weeks.  Our staff will call the home number listed on your records the next business day following your procedure to check on you and address any questions or concerns that you may have at that time regarding the information given to you following your procedure. This is a courtesy call and so if there is no answer at the home number and we have not heard from you through the emergency physician on call, we will assume that you have returned to your regular daily activities without incident.  SIGNATURES/CONFIDENTIALITY: You and/or your care partner have signed paperwork which will be entered into your electronic medical record.  These signatures attest to the fact that that the information above on your After Visit Summary has been reviewed and is understood.  Full responsibility of the confidentiality of this discharge information lies with you and/or your care-partner.   Please follow all discharge instructions given to you by the recovery room nurse. If you have any questions or problems after discharge  please call one of the numbers listed above. You will receive a phone call in the am to see how you are doing and answer any questions you may have. Thank you for choosing [facility] for your health care needs.

## 2011-12-31 ENCOUNTER — Telehealth: Payer: Self-pay | Admitting: *Deleted

## 2011-12-31 NOTE — Telephone Encounter (Signed)
  Follow up Call-  Call back number 12/30/2011  Post procedure Call Back phone  # [phone]  Permission to leave phone message Yes     Patient questions:  Do you have a fever, pain , or abdominal swelling? no Pain Score  0 *  Have you tolerated food without any problems? yes  Have you been able to return to your normal activities? yes  Do you have any questions about your discharge instructions: Diet   no Medications  no Follow up visit  no  Do you have questions or concerns about your Care? no  Actions: * If pain score is 4 or above: No action needed, pain <4.

## 2012-01-13 ENCOUNTER — Other Ambulatory Visit: Payer: Self-pay | Admitting: *Deleted

## 2012-01-13 MED ORDER — ROSUVASTATIN CALCIUM 10 MG PO TABS: 10.0000 mg | ORAL_TABLET | Freq: Every day | ORAL | Status: DC

## 2012-03-09 ENCOUNTER — Other Ambulatory Visit: Payer: Self-pay

## 2012-03-09 ENCOUNTER — Telehealth: Payer: Self-pay | Admitting: Internal Medicine

## 2012-03-09 DIAGNOSIS — I2510 Atherosclerotic heart disease of native coronary artery without angina pectoris: Secondary | ICD-10-CM

## 2012-03-09 DIAGNOSIS — I10 Essential (primary) hypertension: Secondary | ICD-10-CM

## 2012-03-09 DIAGNOSIS — E782 Mixed hyperlipidemia: Secondary | ICD-10-CM

## 2012-03-09 NOTE — Telephone Encounter (Signed)
My certificate runs out 7/14

## 2012-03-09 NOTE — Telephone Encounter (Signed)
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

## 2012-03-09 NOTE — Telephone Encounter (Signed)
Please see Dr. Frederik Pear note below.

## 2012-03-09 NOTE — Telephone Encounter (Signed)
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 Can you see if this is ok & I can call the patient back to schedule or advise Thanks Patient ph# [phone] (will be unavailable 2:30-3:30)

## 2012-03-10 NOTE — Telephone Encounter (Signed)
appt in Sept OK

## 2012-03-11 NOTE — Telephone Encounter (Signed)
Pt is scheduled °

## 2012-03-30 DIAGNOSIS — N400 Enlarged prostate without lower urinary tract symptoms: Secondary | ICD-10-CM | POA: Insufficient documentation

## 2012-04-13 ENCOUNTER — Encounter: Payer: Medicare Other | Admitting: Internal Medicine

## 2012-07-07 ENCOUNTER — Ambulatory Visit (INDEPENDENT_AMBULATORY_CARE_PROVIDER_SITE_OTHER): Payer: Medicare Other | Admitting: *Deleted

## 2012-07-07 ENCOUNTER — Ambulatory Visit (INDEPENDENT_AMBULATORY_CARE_PROVIDER_SITE_OTHER): Payer: Medicare Other | Admitting: Physician Assistant

## 2012-07-07 ENCOUNTER — Encounter: Payer: Self-pay | Admitting: Physician Assistant

## 2012-07-07 DIAGNOSIS — I10 Essential (primary) hypertension: Secondary | ICD-10-CM

## 2012-07-07 DIAGNOSIS — I2510 Atherosclerotic heart disease of native coronary artery without angina pectoris: Secondary | ICD-10-CM

## 2012-07-07 DIAGNOSIS — R7309 Other abnormal glucose: Secondary | ICD-10-CM

## 2012-07-07 DIAGNOSIS — Z0000 Encounter for general adult medical examination without abnormal findings: Secondary | ICD-10-CM

## 2012-07-07 NOTE — Procedures (Signed)
Exercise Treadmill Test  Pre-Exercise Testing Evaluation Rhythm: sinus bradycardia  Rate: 53   PR:  .20 QRS:  .07  QT:  .45 QTc: .42     Test  Exercise Tolerance Test Ordering MD: Tonny Bollman, MD  Interpreting MD: Tereso Newcomer , PA-C  Unique Test No: 1  Treadmill:  1  Indication for ETT: known ASHD  Contraindication to ETT: No   Stress Modality: exercise - treadmill  Cardiac Imaging Performed: non   Protocol: standard Bruce - maximal  Max BP:  188/66  Max MPHR (bpm):  150 85% MPR (bpm):  128  MPHR obtained (bpm):  153 % MPHR obtained:  102%  Reached 85% MPHR (min:sec):  5:45 Total Exercise Time (min-sec):  9:00  Workload in METS:  10.1 Borg Scale: 13  Reason ETT Terminated:  desired heart rate attained    ST Segment Analysis At Rest: normal ST segments - no evidence of significant ST depression With Exercise: no evidence of significant ST depression  Other Information Arrhythmia:  No Angina during ETT:  absent (0) Quality of ETT:  diagnostic  ETT Interpretation:  normal - no evidence of ischemia by ST analysis  Comments: Good exercise tolerance. No chest pain. Normal BP response to exercise. No ST-T changes to suggest ischemia.   Recommendations: Follow up with Dr. Tonny Bollman as directed. Tereso Newcomer, PA-C  10:11 AM 07/07/2012

## 2012-07-08 LAB — BASIC METABOLIC PANEL
BUN: 23 mg/dL (ref 6–23)
CO2: 26 mEq/L (ref 19–32)
Glucose, Bld: 103 mg/dL — ABNORMAL HIGH (ref 70–99)
Potassium: 4 mEq/L (ref 3.5–5.1)
Sodium: 137 mEq/L (ref 135–145)

## 2012-07-08 LAB — HEPATIC FUNCTION PANEL
AST: 21 U/L (ref 0–37)
Bilirubin, Direct: 0 mg/dL (ref 0.0–0.3)
Total Bilirubin: 0.6 mg/dL (ref 0.3–1.2)

## 2012-07-08 LAB — LIPID PANEL
HDL: 38.1 mg/dL — ABNORMAL LOW (ref >39.00)
Total CHOL/HDL Ratio: 4
VLDL: 21 mg/dL (ref 0.0–40.0)

## 2012-07-09 ENCOUNTER — Ambulatory Visit (INDEPENDENT_AMBULATORY_CARE_PROVIDER_SITE_OTHER): Payer: Medicare Other | Admitting: Cardiovascular Disease

## 2012-07-09 ENCOUNTER — Encounter: Payer: Self-pay | Admitting: Cardiovascular Disease

## 2012-07-09 VITALS — BP 150/80 | HR 48 | Ht 73.0 in | Wt 191.4 lb

## 2012-07-09 DIAGNOSIS — E782 Mixed hyperlipidemia: Secondary | ICD-10-CM

## 2012-07-09 MED ORDER — ROSUVASTATIN CALCIUM 5 MG PO TABS: 5.0000 mg | ORAL_TABLET | Freq: Every day | ORAL | Status: DC

## 2012-07-09 NOTE — Assessment & Plan Note (Signed)
The patient is stable without anginal symptoms. His risk factors are well controlled. His office blood pressure reading is high today, but home readings recently have been okay. I've asked him to continue to check his blood pressure twice weekly at home and he will call us with his blood pressure is running greater than 140/90. I reviewed his recent exercise treadmill study and he had excellent exercise tolerance with no symptoms of angina and no significant EKG changes. I will see him back in one year for followup.

## 2012-07-09 NOTE — Assessment & Plan Note (Signed)
Will continue his current medical program for now. Home BPs have been okay. He will track these and call us if the readings are greater than 140/90.

## 2012-07-09 NOTE — Progress Notes (Signed)
   HPI:  72 year old gentleman present for followup evaluation. The patient has coronary artery disease status post CABG in 2003. Overall the patient has been doing well. He's had some aches and pains in his elbows and feet. He wondered whether this was related to a statin drug. He stopped Crestor a few weeks ago. His symptoms are a little but better. He's had no chest pain or pressure. He continues to exercise regularly. He walks briskly for exercise. He specifically denies chest pain, chest pressure, dyspnea, edema, palpitations, lightheadedness, or syncope. He's been compliant with his medications. Home blood pressures have been ranging in the 130/70s.  Outpatient Encounter Prescriptions as of 07/09/2012  Medication Sig Dispense Refill  . amLODipine-olmesartan (AZOR) 10-40 MG per tablet Take 10 tablets by mouth daily.  30 tablet  11  . aspirin 81 MG tablet Take 81 mg by mouth daily.        [name] Kitchen atenolol (TENORMIN) 25 MG tablet TAKE (1) TABLET DAILY  30 tablet  8  . dutasteride (AVODART) 0.5 MG capsule Take 0.5 mg by mouth daily.        . fish oil-omega-3 fatty acids 1000 MG capsule Take 1 g by mouth daily.        . Multiple Vitamins-Minerals (OCUVITE ADULT FORMULA PO) Take 1 tablet by mouth daily.        . rosuvastatin (CRESTOR) 10 MG tablet Take 1 tablet (10 mg total) by mouth daily.  30 tablet  11    Allergies  Allergen Reactions  . Penicillins     hives    Past Medical History  Diagnosis Date  . Hypertension   . Hyperlipidemia   . CAD (coronary artery disease) 2003  . Ulcer 1981  . Blood transfusion 2003  . Adenomatous colon polyp 2005    ROS: Negative except as per HPI  BP 150/80  Pulse 48  Ht 6\' 1"  (1.854 m)  Wt 86.818 kg (191 lb 6.4 oz)  BMI 25.25 kg/m2  SpO2 97%  PHYSICAL EXAM: Pt is alert and oriented, NAD HEENT: normal Neck: JVP - normal, carotids 2+= without bruits Lungs: CTA bilaterally CV: RRR without murmur or gallop Abd: soft, NT, Positive BS, no  hepatomegaly Ext: no C/C/E, distal pulses intact and equal Skin: warm/dry no rash  EKG:  Normal sinus rhythm 67 beats per minute, within normal limits.  ASSESSMENT AND PLAN:

## 2012-07-09 NOTE — Patient Instructions (Addendum)
Your physician has recommended you make the following change in your medication:  decrease Crestor to 5 mg at bedtime  Your physician recommends that you return for fasting lab work in 3 months to recheck your cholesterol  Your physician has requested that you regularly monitor and record your blood pressure readings at home. Please check your blood pressure twice a week. Give our office a call if your blood pressure is running 140/90 or above.  Your physician wants you to follow-up in: 1 year with Dr. Excell Seltzer.  You will receive a reminder letter in the mail two months in advance. If you don't receive a letter, please call our office to schedule the follow-up appointment.

## 2012-07-09 NOTE — Assessment & Plan Note (Signed)
Lipids reviewed. His cholesterol is 149, for) 105, HDL 38, and LDL 90. These numbers are increased from his past readings and I suspect this is due to interruption of Crestor. We will resume at 5 mg daily and see how he tolerates this. We will repeat lipids and LFTs in 3 months.

## 2012-07-12 ENCOUNTER — Telehealth: Payer: Self-pay | Admitting: *Deleted

## 2012-07-12 NOTE — Telephone Encounter (Signed)
lmom labs ok, will fax to PCP

## 2012-07-12 NOTE — Telephone Encounter (Signed)
Message copied by Tarri Fuller on Mon Jul 12, 2012  9:24 AM ------      Message from: [place], [place] T      Created: Mon Jul 12, 2012  8:20 AM       I do not think that I ordered this test.      A1C is normal.      Please fax to PCP.      Tereso Newcomer, PA-C  8:19 AM 07/12/2012

## 2012-07-20 ENCOUNTER — Ambulatory Visit (INDEPENDENT_AMBULATORY_CARE_PROVIDER_SITE_OTHER): Payer: Medicare Other | Admitting: Internal Medicine

## 2012-07-20 DIAGNOSIS — Z23 Encounter for immunization: Secondary | ICD-10-CM

## 2012-09-18 ENCOUNTER — Other Ambulatory Visit: Payer: Self-pay | Admitting: Cardiovascular Disease

## 2012-09-20 ENCOUNTER — Encounter: Payer: Medicare Other | Admitting: Internal Medicine

## 2012-09-20 NOTE — Telephone Encounter (Signed)
..   Requested Prescriptions   Pending Prescriptions Disp Refills  . atenolol (TENORMIN) 25 MG tablet [Pharmacy Med Name: ATENOLOL 25 MG TABLET] 30 tablet 6    Sig: TAKE (1) TABLET DAILY

## 2012-10-07 ENCOUNTER — Other Ambulatory Visit (INDEPENDENT_AMBULATORY_CARE_PROVIDER_SITE_OTHER): Payer: Medicare Other

## 2012-10-07 DIAGNOSIS — E782 Mixed hyperlipidemia: Secondary | ICD-10-CM

## 2012-10-07 LAB — LIPID PANEL
Cholesterol: 121 mg/dL (ref 0–200)
HDL: 36 mg/dL — ABNORMAL LOW (ref >39.00)
LDL Cholesterol: 71 mg/dL (ref 0–99)
VLDL: 13.8 mg/dL (ref 0.0–40.0)

## 2012-12-06 ENCOUNTER — Other Ambulatory Visit: Payer: Self-pay | Admitting: Internal Medicine

## 2013-01-04 ENCOUNTER — Other Ambulatory Visit: Payer: Self-pay | Admitting: *Deleted

## 2013-01-04 ENCOUNTER — Telehealth: Payer: Self-pay | Admitting: Internal Medicine

## 2013-01-04 MED ORDER — AMLODIPINE-OLMESARTAN 10-40 MG PO TABS: 10.0000 | ORAL_TABLET | Freq: Every day | ORAL | Status: DC

## 2013-01-04 NOTE — Telephone Encounter (Signed)
Pt needs a refill on azor [facility] [phone]. Pt has appt on 04-08-2013

## 2013-01-04 NOTE — Telephone Encounter (Signed)
done

## 2013-01-05 NOTE — Telephone Encounter (Signed)
Rx sent to [facility] on 01/04/13 for Azor.

## 2013-03-16 ENCOUNTER — Telehealth: Payer: Self-pay | Admitting: Cardiovascular Disease

## 2013-03-16 NOTE — Telephone Encounter (Signed)
Called patient back. 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 Advised that we do not have the schedule that far out. He will call back at the end of June to see if we have the October schedule.

## 2013-03-16 NOTE — Telephone Encounter (Signed)
New problem    Need an order for stress test to be put into the system in oct.

## 2013-04-08 ENCOUNTER — Encounter: Payer: Self-pay | Admitting: Internal Medicine

## 2013-04-08 ENCOUNTER — Ambulatory Visit (INDEPENDENT_AMBULATORY_CARE_PROVIDER_SITE_OTHER): Payer: Medicare Other | Admitting: Internal Medicine

## 2013-04-08 VITALS — BP 120/70 | HR 56 | Temp 98.2°F | Resp 16 | Ht 73.0 in | Wt 192.0 lb

## 2013-04-08 DIAGNOSIS — E785 Hyperlipidemia, unspecified: Secondary | ICD-10-CM

## 2013-04-08 DIAGNOSIS — T887XXA Unspecified adverse effect of drug or medicament, initial encounter: Secondary | ICD-10-CM

## 2013-04-08 DIAGNOSIS — I10 Essential (primary) hypertension: Secondary | ICD-10-CM

## 2013-04-08 LAB — HEPATIC FUNCTION PANEL
Bilirubin, Direct: 0 mg/dL (ref 0.0–0.3)
Total Bilirubin: 0.8 mg/dL (ref 0.3–1.2)
Total Protein: 6.9 g/dL (ref 6.0–8.3)

## 2013-04-08 LAB — BASIC METABOLIC PANEL
CO2: 27 mEq/L (ref 19–32)
Chloride: 106 mEq/L (ref 96–112)
Sodium: 141 mEq/L (ref 135–145)

## 2013-04-08 LAB — LIPID PANEL
Cholesterol: 109 mg/dL (ref 0–200)
HDL: 32 mg/dL — ABNORMAL LOW (ref >39.00)
Total CHOL/HDL Ratio: 3
Triglycerides: 88 mg/dL (ref 0.0–149.0)

## 2013-04-08 NOTE — Progress Notes (Signed)
Subjective:    Patient ID: Russell [name], male    DOB: 1941-03-20, 72 y.o.   MRN: [id number]  HPI Patient is 72 year old male who presents for followup of hypertension hyperlipidemia and a history of benign prostatic hypertrophy.  He has had a biopsy of his prostate in the past for elevated PSA he is followed by urology and recently had a 30% increase in his PSA from the mid threes to the low four   Review of Systems  Constitutional: Negative for fever and fatigue.  HENT: Negative for hearing loss, congestion, neck pain and postnasal drip.   Eyes: Negative for discharge, redness and visual disturbance.  Respiratory: Negative for cough, shortness of breath and wheezing.   Cardiovascular: Negative for leg swelling.  Gastrointestinal: Negative for abdominal pain, constipation and abdominal distention.  Genitourinary: Negative for urgency and frequency.  Musculoskeletal: Negative for joint swelling and arthralgias.  Skin: Negative for color change and rash.  Neurological: Negative for weakness and light-headedness.  Hematological: Negative for adenopathy.  Psychiatric/Behavioral: Negative for behavioral problems.   Past Medical History  Diagnosis Date  . Hypertension   . Hyperlipidemia   . CAD (coronary artery disease) 2003  . Ulcer 1981  . Blood transfusion 2003  . Adenomatous colon polyp 2005    History   Social History  . Marital Status: Married    Spouse Name: N/A    Number of Children: N/A  . Years of Education: N/A   Occupational History  . Not on file.   Social History Main Topics  . Smoking status: Never Smoker   . Smokeless tobacco: Never Used  . Alcohol Use: No  . Drug Use: No  . Sexually Active: Not on file   Other Topics Concern  . Not on file   Social History Narrative  . No narrative on file    Past Surgical History  Procedure Laterality Date  . Coronary artery bypass graft  2003    5 vessel   . Hemorrhoid surgery    . Lumbar laminectomy     . Ganglion cystectomy       hand  . Colonoscopy      negative  2008, polyp 2005; Dr Russella Dar  . Flexible sigmoidoscopy      1992, 1997  . Inguinal hernia repair    . Basal cell carcinoma excision  04/2011    Mohs , Dr Alean Rinne    Family History  Problem Relation Age of Onset  . Heart attack Father 41    suicide post MI  . Heart attack Brother 53  . Aneurysm Mother 36    cns; pacer  . Colon cancer Neg Hx   . Esophageal cancer Neg Hx   . Rectal cancer Neg Hx   . Stomach cancer Neg Hx     Allergies  Allergen Reactions  . Penicillins     hives    Current Outpatient Prescriptions on File Prior to Visit  Medication Sig Dispense Refill  . amLODipine-olmesartan (AZOR) 10-40 MG per tablet Take 10 tablets by mouth daily.  30 tablet  11  . aspirin 81 MG tablet Take 81 mg by mouth daily.        [name] Kitchen atenolol (TENORMIN) 25 MG tablet TAKE (1) TABLET DAILY  30 tablet  6  . dutasteride (AVODART) 0.5 MG capsule Take 0.5 mg by mouth daily.        . fish oil-omega-3 fatty acids 1000 MG capsule Take 1 g by mouth daily.        [name] Kitchen  Multiple Vitamins-Minerals (OCUVITE ADULT FORMULA PO) Take 1 tablet by mouth daily.        . rosuvastatin (CRESTOR) 5 MG tablet Take 1 tablet (5 mg total) by mouth daily.  30 tablet  11   No current facility-administered medications on file prior to visit.    BP 120/70  Pulse 56  Temp(Src) 98.2 F (36.8 C)  Resp 16  Ht 6\' 1"  (1.854 m)  Wt 192 lb (87.091 kg)  BMI 25.34 kg/m2       Objective:   Physical Exam  Constitutional: He appears well-developed and well-nourished.  HENT:  Head: Normocephalic and atraumatic.  Eyes: Conjunctivae are normal. Pupils are equal, round, and reactive to light.  Neck: Normal range of motion. Neck supple.  Cardiovascular: Normal rate and regular rhythm.   Pulmonary/Chest: Effort normal and breath sounds normal.  Abdominal: Soft. Bowel sounds are normal.          Assessment & Plan:  Patient has elevation of PSA. He is  decided to do watchful waiting and a 6 month PSA repeat we discussed the disadvantages and advantages of watchful waiting.  I believe that this is appropriate given his overall health history.  Blood pressure stable his current medications lipids will be monitored today

## 2013-04-12 ENCOUNTER — Other Ambulatory Visit: Payer: Self-pay | Admitting: Cardiovascular Disease

## 2013-04-14 ENCOUNTER — Encounter: Payer: Self-pay | Admitting: Cardiovascular Disease

## 2013-05-24 ENCOUNTER — Encounter: Payer: Self-pay | Admitting: Cardiovascular Disease

## 2013-05-31 ENCOUNTER — Encounter: Payer: Self-pay | Admitting: Cardiovascular Disease

## 2013-06-03 ENCOUNTER — Other Ambulatory Visit: Payer: Self-pay

## 2013-06-03 DIAGNOSIS — I2510 Atherosclerotic heart disease of native coronary artery without angina pectoris: Secondary | ICD-10-CM

## 2013-06-03 DIAGNOSIS — I10 Essential (primary) hypertension: Secondary | ICD-10-CM

## 2013-06-03 DIAGNOSIS — E7800 Pure hypercholesterolemia, unspecified: Secondary | ICD-10-CM

## 2013-06-10 ENCOUNTER — Encounter: Payer: Self-pay | Admitting: Cardiovascular Disease

## 2013-07-06 ENCOUNTER — Encounter: Payer: Self-pay | Admitting: Physician Assistant

## 2013-07-13 ENCOUNTER — Telehealth: Payer: Self-pay | Admitting: Cardiovascular Disease

## 2013-07-13 NOTE — Telephone Encounter (Signed)
Pt wanted to see if it was fine with Dr. Excell Seltzer to have the O/V before having the stress test ans lab work.  Dr Excell Seltzer aware and states  it will be okay if pt does not mind the back and forth to the office that MD will write the letter the same as he always have done every year. Pt is aware and verbalized understanding.

## 2013-07-13 NOTE — Telephone Encounter (Signed)
New problem  Appt had to be Resch due to scot being out of office// Scheduled Exercise test for 10/6 same day as ov w cooper... Pt is not comfortable with this schedule as he is used to the OV before the stress test. Was advised per staff that this was ok to schedule. Pt request a call back to confirm that this appt is correct.

## 2013-07-15 ENCOUNTER — Other Ambulatory Visit: Payer: Self-pay | Admitting: Cardiovascular Disease

## 2013-08-02 ENCOUNTER — Encounter: Payer: Self-pay | Admitting: Cardiovascular Disease

## 2013-08-04 ENCOUNTER — Encounter: Payer: Medicare Other | Admitting: Physician Assistant

## 2013-08-04 ENCOUNTER — Other Ambulatory Visit: Payer: Medicare Other

## 2013-08-08 ENCOUNTER — Encounter: Payer: Self-pay | Admitting: Cardiovascular Disease

## 2013-08-08 ENCOUNTER — Other Ambulatory Visit: Payer: Medicare Other

## 2013-08-08 ENCOUNTER — Ambulatory Visit (INDEPENDENT_AMBULATORY_CARE_PROVIDER_SITE_OTHER): Payer: Medicare Other | Admitting: Physician Assistant

## 2013-08-08 ENCOUNTER — Encounter: Payer: Self-pay | Admitting: Physician Assistant

## 2013-08-08 ENCOUNTER — Ambulatory Visit (INDEPENDENT_AMBULATORY_CARE_PROVIDER_SITE_OTHER): Payer: Medicare Other | Admitting: Cardiovascular Disease

## 2013-08-08 VITALS — BP 130/72 | HR 59 | Ht 73.0 in | Wt 188.0 lb

## 2013-08-08 DIAGNOSIS — I2510 Atherosclerotic heart disease of native coronary artery without angina pectoris: Secondary | ICD-10-CM

## 2013-08-08 DIAGNOSIS — E7800 Pure hypercholesterolemia, unspecified: Secondary | ICD-10-CM

## 2013-08-08 DIAGNOSIS — I10 Essential (primary) hypertension: Secondary | ICD-10-CM

## 2013-08-08 DIAGNOSIS — I25810 Atherosclerosis of coronary artery bypass graft(s) without angina pectoris: Secondary | ICD-10-CM

## 2013-08-08 DIAGNOSIS — E782 Mixed hyperlipidemia: Secondary | ICD-10-CM

## 2013-08-08 LAB — CBC WITH DIFFERENTIAL/PLATELET
Basophils Relative: 2.3 % (ref 0.0–3.0)
Eosinophils Absolute: 0.3 10*3/uL (ref 0.0–0.7)
HCT: 43.5 % (ref 39.0–52.0)
Hemoglobin: 14.7 g/dL (ref 13.0–17.0)
Lymphs Abs: 1.7 10*3/uL (ref 0.7–4.0)
MCHC: 33.8 g/dL (ref 30.0–36.0)
MCV: 90.2 fl (ref 78.0–100.0)
Monocytes Absolute: 0.5 10*3/uL (ref 0.1–1.0)
Neutro Abs: 4.4 10*3/uL (ref 1.4–7.7)
RBC: 4.82 Mil/uL (ref 4.22–5.81)

## 2013-08-08 LAB — HEPATIC FUNCTION PANEL
ALT: 14 U/L (ref 0–53)
AST: 21 U/L (ref 0–37)
Albumin: 4.4 g/dL (ref 3.5–5.2)
Total Bilirubin: 0.7 mg/dL (ref 0.3–1.2)

## 2013-08-08 LAB — LIPID PANEL
HDL: 38.3 mg/dL — ABNORMAL LOW (ref >39.00)
Total CHOL/HDL Ratio: 3

## 2013-08-08 LAB — BASIC METABOLIC PANEL
CO2: 30 mEq/L (ref 19–32)
Calcium: 9.2 mg/dL (ref 8.4–10.5)
GFR: 72.23 mL/min (ref >60.00)
Glucose, Bld: 94 mg/dL (ref 70–99)
Potassium: 4 mEq/L (ref 3.5–5.1)
Sodium: 138 mEq/L (ref 135–145)

## 2013-08-08 NOTE — Progress Notes (Signed)
Exercise Treadmill Test  Pre-Exercise Testing Evaluation Rhythm: normal sinus  Rate: 60     Test  Exercise Tolerance Test Ordering MD: Tonny Bollman, MD  Interpreting MD: Ron Agee PA-C  Unique Test No: 1  Treadmill:  1  Indication for ETT: known ASHD  Contraindication to ETT: No   Stress Modality: exercise - treadmill  Cardiac Imaging Performed: non   Protocol: standard Bruce - maximal  Max BP:  220/69  Max MPHR (bpm):  149 85% MPR (bpm):  127  MPHR obtained (bpm):  151 % MPHR obtained:  101  Reached 85% MPHR (min:sec):  5:37 Total Exercise Time (min-sec):  9:00  Workload in METS:  10.1 Borg Scale: 13  Reason ETT Terminated:  desired heart rate attained    ST Segment Analysis At Rest: normal ST segments - no evidence of significant ST depression With Exercise: non-specific ST changes  Other Information Arrhythmia:  No Angina during ETT:  absent (0) Quality of ETT:  diagnostic  ETT Interpretation:  normal - no evidence of ischemia by ST analysis  Comments: Good exercise capacity. No chest pain. Normal BP response to exercise. No ST-T changes to suggest ischemia.   Recommendations: Follow up with Dr. Tonny Bollman as directed. Signed,  Tereso Newcomer, PA-C   08/08/2013 2:40 PM

## 2013-08-08 NOTE — Progress Notes (Signed)
    HPI: 72 year old gentleman presenting for followup evaluation. The patient is followed for coronary artery disease. He underwent coronary bypass grafting in 2003. He's been followed with annual stress testing per [facility] requirements. He will undergo a treadmill evaluation and lab work later today.  She is doing very well. He exercises regularly. He walks 1-1/2 miles daily and does walk includes 2 pills. He has no symptoms with that level of exertion. He specifically denies chest pain, chest pressure, shortness of breath, or palpitations. He has had no lightheadedness or syncope. He complains of easy bruising on his forearms. Otherwise is without complaints today. Blood pressure has been well-controlled.  Outpatient Encounter Prescriptions as of 08/08/2013  Medication Sig Dispense Refill  . amLODipine-olmesartan (AZOR) 10-40 MG per tablet Take 1 tablet by mouth daily.      [name] Kitchen aspirin 81 MG tablet Take 81 mg by mouth daily.        [name] Kitchen atenolol (TENORMIN) 25 MG tablet TAKE (1) TABLET DAILY  30 tablet  5  . CRESTOR 5 MG tablet TAKE (1) TABLET DAILY  30 tablet  6  . dutasteride (AVODART) 0.5 MG capsule Take 0.5 mg by mouth daily.        . fish oil-omega-3 fatty acids 1000 MG capsule Take 1 g by mouth daily.        . Multiple Vitamins-Minerals (OCUVITE ADULT FORMULA PO) Take 1 tablet by mouth daily.        . [DISCONTINUED] amLODipine-olmesartan (AZOR) 10-40 MG per tablet Take 10 tablets by mouth daily.  30 tablet  11   No facility-administered encounter medications on file as of 08/08/2013.    Allergies  Allergen Reactions  . Penicillins     hives    Past Medical History  Diagnosis Date  . Hypertension   . Hyperlipidemia   . CAD (coronary artery disease) 2003  . Ulcer 1981  . Blood transfusion 2003  . 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  ASSESSMENT AND PLAN: 1. Coronary artery disease, native vessel. The patient remained stable without anginal symptoms. He is on appropriate medical program which includes aspirin, a beta blocker, and a statin drug at low dose. Per FAA guidelines, he will undergo an exercise treadmill test later today. For followup I would like to see him back in one year.  2. Hyperlipidemia. Lipids to be drawn this morning. He is on a low dose of Crestor. Most recent lipid panel from 04/08/2013 showed a total cholesterol of 109, triglycerides 88, HDL 32, and LDL 59.  3. Hypertension, essential. Blood pressure is well controlled on a combination of amlodipine, olmesartan, and atenolol.  Kaycen Whitworth 08/08/2013 10:46 AM

## 2013-08-08 NOTE — Patient Instructions (Addendum)
Your physician recommends that you continue on your current medications as directed. Please refer to the Current Medication list given to you today.  Your physician wants you to follow-up in: 1 year You will receive a reminder letter in the mail two months in advance. If you don't receive a letter, please call our office to schedule the follow-up appointment.  Your physician recommends that you have  lab work today Lipid, liver,BMP,CBC

## 2013-08-09 ENCOUNTER — Encounter (HOSPITAL_COMMUNITY): Payer: Self-pay | Admitting: Cardiovascular Disease

## 2013-08-09 ENCOUNTER — Telehealth: Payer: Self-pay | Admitting: *Deleted

## 2013-08-09 NOTE — Telephone Encounter (Signed)
s/w pt about the error on the stress test ekg sattsing pt is male. This has now been corrected by [facility] in the treadmill rm today and a new copy was printed with the correction done manually by shelly. This was given to Lauren B. RN Dr. Earmon Phoenix RN, this will be placed in an envelope with lab results and a letter from Dr. Excell Seltzer for the [facility]. Lauren B. RN will call pt when all is ready for pick up. Pt said thank you for fixing this error so fast.

## 2013-08-22 ENCOUNTER — Ambulatory Visit (INDEPENDENT_AMBULATORY_CARE_PROVIDER_SITE_OTHER): Payer: Medicare Other | Admitting: Internal Medicine

## 2013-08-22 DIAGNOSIS — Z23 Encounter for immunization: Secondary | ICD-10-CM

## 2013-10-15 ENCOUNTER — Other Ambulatory Visit: Payer: Self-pay | Admitting: Cardiovascular Disease

## 2013-12-28 ENCOUNTER — Encounter: Payer: Self-pay | Admitting: Internal Medicine

## 2014-01-11 ENCOUNTER — Other Ambulatory Visit: Payer: Self-pay | Admitting: Internal Medicine

## 2014-02-10 ENCOUNTER — Other Ambulatory Visit: Payer: Self-pay | Admitting: Cardiovascular Disease

## 2014-03-06 ENCOUNTER — Encounter (HOSPITAL_COMMUNITY): Payer: Self-pay | Admitting: Cardiovascular Disease

## 2014-05-08 ENCOUNTER — Telehealth: Payer: Self-pay | Admitting: Cardiovascular Disease

## 2014-05-08 DIAGNOSIS — I25709 Atherosclerosis of coronary artery bypass graft(s), unspecified, with unspecified angina pectoris: Secondary | ICD-10-CM

## 2014-05-08 NOTE — Telephone Encounter (Signed)
New Message  Pt called to schedule with Dr. Burt Knack. No orders for GXT at this time. Cannot schedule without orders. Pt also states he will need a lab. No orders for that either. Please assist. Will Schedule annual visit with PA or Np with pt once orders are placed in for the test. Pt states he does not want to schedule an OV before scheduling the test// please assist

## 2014-05-08 NOTE — Telephone Encounter (Signed)
Error

## 2014-05-08 NOTE — Telephone Encounter (Signed)
WILL FORWARD  MESSAGE TO LAUREN TO  ADDRESS./CY

## 2014-05-11 ENCOUNTER — Encounter: Payer: Self-pay | Admitting: *Deleted

## 2014-05-11 NOTE — Telephone Encounter (Signed)
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 I scheduled the pt for an exercise nuclear test for 08-03-14 @ 9am. Instructions mailed to the pt. Lab orders entered for drawing at the time of his stress test. He will need a f/u with dr cooper in oct. Aware schedule is not available yet and we will contact him when able to schedule. Pt agreed with this plan.

## 2014-05-22 ENCOUNTER — Encounter (HOSPITAL_COMMUNITY): Payer: Self-pay | Admitting: Cardiovascular Disease

## 2014-07-08 ENCOUNTER — Other Ambulatory Visit: Payer: Self-pay | Admitting: Internal Medicine

## 2014-07-11 ENCOUNTER — Other Ambulatory Visit: Payer: Self-pay | Admitting: Cardiovascular Disease

## 2014-07-21 ENCOUNTER — Encounter: Payer: Self-pay | Admitting: Cardiovascular Disease

## 2014-08-03 ENCOUNTER — Other Ambulatory Visit (INDEPENDENT_AMBULATORY_CARE_PROVIDER_SITE_OTHER): Payer: Medicare Other | Admitting: *Deleted

## 2014-08-03 ENCOUNTER — Ambulatory Visit (HOSPITAL_COMMUNITY): Payer: Medicare Other | Attending: Cardiovascular Disease | Admitting: Radiology

## 2014-08-03 VITALS — BP 147/85 | HR 54 | Ht 73.0 in | Wt 188.0 lb

## 2014-08-03 DIAGNOSIS — E785 Hyperlipidemia, unspecified: Secondary | ICD-10-CM | POA: Insufficient documentation

## 2014-08-03 DIAGNOSIS — I2510 Atherosclerotic heart disease of native coronary artery without angina pectoris: Secondary | ICD-10-CM | POA: Diagnosis not present

## 2014-08-03 DIAGNOSIS — I10 Essential (primary) hypertension: Secondary | ICD-10-CM | POA: Diagnosis not present

## 2014-08-03 DIAGNOSIS — I25709 Atherosclerosis of coronary artery bypass graft(s), unspecified, with unspecified angina pectoris: Secondary | ICD-10-CM

## 2014-08-03 LAB — CBC WITH DIFFERENTIAL/PLATELET
BASOS PCT: 1.7 % (ref 0.0–3.0)
Basophils Absolute: 0.1 10*3/uL (ref 0.0–0.1)
EOS PCT: 8.9 % — AB (ref 0.0–5.0)
Eosinophils Absolute: 0.6 10*3/uL (ref 0.0–0.7)
HCT: 42.4 % (ref 39.0–52.0)
HEMOGLOBIN: 14 g/dL (ref 13.0–17.0)
Lymphocytes Relative: 25.9 % (ref 12.0–46.0)
Lymphs Abs: 1.6 10*3/uL (ref 0.7–4.0)
MCHC: 33.1 g/dL (ref 30.0–36.0)
MCV: 92.4 fl (ref 78.0–100.0)
MONOS PCT: 7.3 % (ref 3.0–12.0)
Monocytes Absolute: 0.5 10*3/uL (ref 0.1–1.0)
Neutro Abs: 3.6 10*3/uL (ref 1.4–7.7)
Neutrophils Relative %: 56.2 % (ref 43.0–77.0)
PLATELETS: 199 10*3/uL (ref 150.0–400.0)
RBC: 4.59 Mil/uL (ref 4.22–5.81)
RDW: 14 % (ref 11.5–15.5)
WBC: 6.4 10*3/uL (ref 4.0–10.5)

## 2014-08-03 LAB — HEPATIC FUNCTION PANEL
ALT: 20 U/L (ref 0–53)
AST: 21 U/L (ref 0–37)
Albumin: 4.2 g/dL (ref 3.5–5.2)
Alkaline Phosphatase: 52 U/L (ref 39–117)
BILIRUBIN TOTAL: 0.8 mg/dL (ref 0.2–1.2)
Bilirubin, Direct: 0.1 mg/dL (ref 0.0–0.3)
Total Protein: 7 g/dL (ref 6.0–8.3)

## 2014-08-03 LAB — BASIC METABOLIC PANEL
BUN: 30 mg/dL — AB (ref 6–23)
CALCIUM: 9 mg/dL (ref 8.4–10.5)
CO2: 29 meq/L (ref 19–32)
Chloride: 105 mEq/L (ref 96–112)
Creatinine, Ser: 1.2 mg/dL (ref 0.4–1.5)
GFR: 66.28 mL/min (ref >60.00)
GLUCOSE: 95 mg/dL (ref 70–99)
Potassium: 3.8 mEq/L (ref 3.5–5.1)
SODIUM: 140 meq/L (ref 135–145)

## 2014-08-03 LAB — LIPID PANEL
Cholesterol: 107 mg/dL (ref 0–200)
HDL: 33.6 mg/dL — AB (ref >39.00)
LDL CALC: 57 mg/dL (ref 0–99)
NONHDL: 73.4
Total CHOL/HDL Ratio: 3
Triglycerides: 83 mg/dL (ref 0.0–149.0)
VLDL: 16.6 mg/dL (ref 0.0–40.0)

## 2014-08-03 MED ORDER — TECHNETIUM TC 99M SESTAMIBI GENERIC - CARDIOLITE
33.0000 | Freq: Once | INTRAVENOUS | Status: AC | PRN
Start: 2014-08-03 — End: 2014-08-03
  Administered 2014-08-03: 33 via INTRAVENOUS

## 2014-08-03 MED ORDER — TECHNETIUM TC 99M SESTAMIBI GENERIC - CARDIOLITE
11.0000 | Freq: Once | INTRAVENOUS | Status: AC | PRN
  Administered 2014-08-03: 11 via INTRAVENOUS

## 2014-08-03 NOTE — Progress Notes (Signed)
[place] [facility] NUCLEAR MED [address] [place], [place] [address] [phone]    Cardiology Nuclear Med Study  Russell [name] is a 73 y.o. male     MRN : [id number]     DOB: 08/30/1941  Procedure Date: 08/03/2014  Nuclear Med Background Indication for Stress Test:  Evaluation for Ischemia, Graft Patency and FAA Exam History:  CAD, MPI 2012 (scar) EF 59% Cardiac Risk Factors: Hypertension and Lipids  Symptoms:  None indicated   Nuclear Pre-Procedure Caffeine/Decaff Intake:  None NPO After: 9:00pm   Lungs:  clear O2 Sat: 96% on room air. IV 0.9% NS with Angio Cath:  22g  IV Site: R Antecubital x 1, tolerated well IV Started by:  Irven Baltimore, RN  Chest Size (in):  46 Cup Size: n/a  Height: 6\' 1"  (1.854 m)  Weight:     BMI:  There is no weight on file to calculate BMI. Tech Comments:  Patient held Atenolol x 96 hrs per patient. Irven Baltimore, RN.    Nuclear Med Study 1 or 2 day study: 1 day  Stress Test Type:  Stress  Reading MD: N/A  Order Authorizing Provider:  Sherren Mocha, MD  Resting Radionuclide: Technetium 87m Sestamibi  Resting Radionuclide Dose: 11.0 mCi   Stress Radionuclide:  Technetium 32m Sestamibi  Stress Radionuclide Dose: 33.0 mCi           Stress Protocol Rest HR: 54 Stress HR: 151  Rest BP: 147/85 Stress BP: 191/63  Exercise Time (min): 9:00 METS: 10.1           Dose of Adenosine (mg):  n/a Dose of Lexiscan: n/a mg  Dose of Atropine (mg): n/a Dose of Dobutamine: n/a mcg/kg/min (at max HR)  Stress Test Technologist: Glade Lloyd, BS-ES  Nuclear Technologist:  Margie Ege     Rest Procedure:  Myocardial perfusion imaging was performed at rest 45 minutes following the intravenous administration of Technetium 55m Sestamibi. Rest ECG: Normal sinus rhythm. Normal EKG  Stress Procedure:  The patient exercised on the treadmill utilizing the Bruce Protocol for 9:00 minutes. The patient stopped due to fatigue and denied any  chest pain.  Technetium 72m Sestamibi was injected at peak exercise and myocardial perfusion imaging was performed after a brief delay. Stress ECG: No significant change from baseline ECG  QPS Raw Data Images:  Normal; no motion artifact; normal heart/lung ratio. Stress Images:  There is a large area of mild decreased uptake of affecting the base/mid/apical inferior segments, base/mid inferolateral segments, and apical lateral segment. Rest Images:  The rest images are the same as the stress images. Subtraction (SDS):  No evidence of ischemia. Transient Ischemic Dilatation (Normal <1.22):  0.92 Lung/Heart Ratio (Normal <0.45):  0.27  Quantitative Gated Spect Images QGS EDV:  119 ml QGS ESV:  57 ml  Impression Exercise Capacity:  Excellent exercise capacity. BP Response:  Normal blood pressure response. Clinical Symptoms:  No symptoms. ECG Impression:  No significant ST segment change suggestive of ischemia. Comparison with Prior Nuclear Study;  The study is compared with the report of the study from September, 2012  Overall Impression:  The study is abnormal, but unchanged from September, 2012. There is mild scar affecting the inferior wall and inferolateral wall. There is no ischemia. This is a low risk scan.  LV Ejection Fraction: 52%.  LV Wall Motion:  There is very slight decreased motion of the inferior wall.  Dola Argyle, MD

## 2014-08-07 ENCOUNTER — Encounter: Payer: Self-pay | Admitting: Cardiovascular Disease

## 2014-08-07 ENCOUNTER — Ambulatory Visit (INDEPENDENT_AMBULATORY_CARE_PROVIDER_SITE_OTHER): Payer: Medicare Other | Admitting: Cardiovascular Disease

## 2014-08-07 ENCOUNTER — Other Ambulatory Visit: Payer: Self-pay | Admitting: Cardiovascular Disease

## 2014-08-07 VITALS — BP 138/80 | HR 57 | Ht 72.0 in | Wt 192.0 lb

## 2014-08-07 DIAGNOSIS — I25709 Atherosclerosis of coronary artery bypass graft(s), unspecified, with unspecified angina pectoris: Secondary | ICD-10-CM

## 2014-08-07 DIAGNOSIS — I10 Essential (primary) hypertension: Secondary | ICD-10-CM

## 2014-08-07 DIAGNOSIS — Z23 Encounter for immunization: Secondary | ICD-10-CM

## 2014-08-07 MED ORDER — AMLODIPINE-OLMESARTAN 10-40 MG PO TABS: ORAL_TABLET | ORAL | Status: DC

## 2014-08-07 NOTE — Patient Instructions (Signed)
Your physician recommends that you continue on your current medications as directed. Please refer to the Current Medication list given to you today.  Your physician wants you to follow-up in: 1 YEAR with Dr Burt Knack. You will receive a reminder letter in the mail two months in advance. If you don't receive a letter, please call our office to schedule the follow-up appointment.  Your physician has requested that you have an exercise tolerance test in 1 YEAR. For further information please visit [email or web address]. Please also follow instruction sheet, as given.  Your physician recommends that you return for a FASTING LIPID, LIVER, BMP and CBC in 1 YEAR.

## 2014-08-07 NOTE — Progress Notes (Signed)
HPI:  73 year old gentleman presenting for followup evaluation. The patient is followed for coronary artery disease. He underwent coronary bypass grafting in 2003. He was treated with a LIMA to LAD, saphenous vein graft to diagonal, saphenous vein graft to OM1, and sequential saphenous vein graft to second and third OM branches. He's been followed with annual stress testing per [facility] requirements. We have alternated exercise treadmill ECG testing and nuclear stress testing on a year to year basis. This is done in concordance with FAA guidelines. He just underwent an exercise Myoview scan 08/03/2014 which was unchanged compared to his previous study in 2012. There is a fixed defect in the inferior and inferolateral wall without significant ischemia. Gated LVEF is 52%.  Recent labs, also drawn October 1, demonstrate normal renal function with a creatinine of 1.2 mg/dL, normal LFTs, an excellent lipid panel with a cholesterol of 107, trig was read 83, HDL 34, and LDL 57. CBC is within normal limits.  The patient continues to do well from a clinical perspective. He has no chest pain, shortness of breath, leg swelling, lightheadedness, or heart palpitations. He reports no changes in his medical program. He has some questions about the risk/benefit of continued statin use. He walks approximately 1.5 miles up and down hills on regular basis without exertional symptoms.   Outpatient Encounter Prescriptions as of 08/07/2014  Medication Sig  . aspirin 81 MG tablet Take 81 mg by mouth daily.    [name] Kitchen atenolol (TENORMIN) 25 MG tablet TAKE 1 TABLET BY MOUTH ONCE DAILY  . AZOR 10-40 MG per tablet TAKE ONE TABLET BY MOUTH DAILY  . CRESTOR 5 MG tablet TAKE ONE TABLET BY MOUTH DAILY FOR CHOLESTEROL  . dutasteride (AVODART) 0.5 MG capsule Take 0.5 mg by mouth daily.    . Multiple Vitamins-Minerals (OCUVITE ADULT FORMULA PO) Take 1 tablet by mouth daily.    . [DISCONTINUED] fish oil-omega-3 fatty acids 1000 MG capsule  Take 1 g by mouth daily.      Allergies  Allergen Reactions  . Penicillins     hives    Past Medical History  Diagnosis Date  . Hypertension   . Hyperlipidemia   . CAD (coronary artery disease) 2003  . Ulcer 1981  . Blood transfusion 2003  . Adenomatous colon polyp 2005    ROS: Negative except as per HPI  BP 138/80  Pulse 57  Ht 6' (1.829 m)  Wt 192 lb (87.091 kg)  BMI 26.03 kg/m2  PHYSICAL EXAM: Pt is alert and oriented, NAD HEENT: normal Neck: JVP - normal, carotids 2+= without bruits Lungs: CTA bilaterally CV: RRR without murmur or gallop Abd: soft, NT, Positive BS, no hepatomegaly Ext: no C/C/E, distal pulses intact and equal Skin: warm/dry no rash  Myoview scan 08/03/2014: Impression  Exercise Capacity: Excellent exercise capacity.  BP Response: Normal blood pressure response.  Clinical Symptoms: No symptoms.  ECG Impression: No significant ST segment change suggestive of ischemia.  Comparison with Prior Nuclear Study; The study is compared with the report of the study from September, 2012  Overall Impression: The study is abnormal, but unchanged from September, 2012. There is mild scar affecting the inferior wall and inferolateral wall. There is no ischemia. This is a low risk scan.  LV Ejection Fraction: 52%. LV Wall Motion: There is very slight decreased motion of the inferior wall.  Dola Argyle, MD  ASSESSMENT AND PLAN: 1. Coronary artery disease, native vessel, without angina. The patient demonstrates continued stability with no  anginal symptoms and a good workload. His stress Myoview scan was reviewed and this demonstrates no significant ischemia. Inferior wall scar is unchanged in the study is "low risk." He will continue on his current medical program which includes aspirin for antiplatelet therapy, a beta blocker, and a statin drug. I will see him back in one year.  2. Hyperlipidemia. Lipids are at goal on low-dose Crestor. We reviewed the  risks/benefits of continuing a statin drug. No changes were made today. I think it is clearly favorable that he continue on a statin considering his known coronary artery disease and previous CABG.  3. Essential hypertension. Blood pressure well controlled on a combination of atenolol, amlodipine, and olmesartan. Renal function stable.  Sherren Mocha MD 08/07/2014 2:47 PM

## 2014-09-19 ENCOUNTER — Encounter: Payer: Self-pay | Admitting: Family Medicine

## 2014-09-19 ENCOUNTER — Ambulatory Visit (INDEPENDENT_AMBULATORY_CARE_PROVIDER_SITE_OTHER): Payer: Medicare Other | Admitting: Family Medicine

## 2014-09-19 VITALS — BP 137/76 | HR 55 | Temp 97.8°F | Resp 18 | Ht 73.0 in | Wt 190.0 lb

## 2014-09-19 DIAGNOSIS — R972 Elevated prostate specific antigen [PSA]: Secondary | ICD-10-CM

## 2014-09-19 DIAGNOSIS — I25709 Atherosclerosis of coronary artery bypass graft(s), unspecified, with unspecified angina pectoris: Secondary | ICD-10-CM

## 2014-09-19 DIAGNOSIS — I10 Essential (primary) hypertension: Secondary | ICD-10-CM

## 2014-09-19 DIAGNOSIS — E782 Mixed hyperlipidemia: Secondary | ICD-10-CM

## 2014-09-19 DIAGNOSIS — N400 Enlarged prostate without lower urinary tract symptoms: Secondary | ICD-10-CM | POA: Insufficient documentation

## 2014-09-19 NOTE — Progress Notes (Signed)
Pre visit review using our clinic review tool, if applicable. No additional management support is needed unless otherwise documented below in the visit note. 

## 2014-09-19 NOTE — Progress Notes (Signed)
Office Note 09/19/2014  CC:  Chief Complaint  Patient presents with  . Establish Care   HPI:  Russell [name] is a 73 y.o. White male who is here to establish care. Patient's most recent primary MD: Dr. Arnoldo Morale (retiring from clinical practice). Old records in EPIC/HL EMR were reviewed prior to or during today's visit.  He rarely monitors bp at home. He is strictly compliant with meds, has to be a stickler with med regimen/records due to Excell[employer].  ROS: no chest pain, no SOB, no dizziness, no palpitations, no abd pain, no undesired wt loss.  Past Medical History  Diagnosis Date  . Hypertension   . Hyperlipidemia   . CAD (coronary artery disease) 2003  . Ulcer 1981  . Blood transfusion 2003  . Adenomatous colon polyp 2005  . BPH (benign prostatic hypertrophy)   . Chronic renal insufficiency, stage II (mild)     CrCl 60s  . Elevated PSA     watchful waiting approach being taken by pt; most recent PSA was done in 12/2013 by his urologist at [facility], Dr. Amalia Hailey and this number was slightly improved: plan is for repeat in 1 yr with Dr. Amalia Hailey.    Past Surgical History  Procedure Laterality Date  . Coronary artery bypass graft  2003    5 vessel (Dr. Cyndia Bent)  . Hemorrhoid surgery      Dr. Harlow Asa  . Lumbar laminectomy      Dr. Quincy Carnes  . Ganglion cystectomy       hand  . Colonoscopy      12/2011 'tics, int hem, o/w NORMAL.  Recall 5 yrs.  Negative 2008, polyp 2005; Dr Fuller Plan  . Flexible sigmoidoscopy      1992, 1997  . Inguinal hernia repair Left   . Basal cell carcinoma excision  04/2011    Mohs , Dr Harvel Quale  . Cardiovascular stress test  08/2014    Low risk: no ischemia.  Inferior/inferolateral scar and slight decreased mobility of LV wall in this region, EF normal.  . Biopsy prostate  approx 2010    Dr. Amalia Hailey.    Family History  Problem Relation Age of Onset  . Heart attack Father 61    suicide post MI  . Heart attack Brother 32  . Aneurysm Mother 91    cns; pacer  .  Colon cancer Neg Hx   . Esophageal cancer Neg Hx   . Rectal cancer Neg Hx   . Stomach cancer Neg Hx     History   Social History  . Marital Status: Married    Spouse Name: N/A    Number of Children: N/A  . Years of Education: N/A   Occupational History  . Not on file.   Social History Main Topics  . Smoking status: Never Smoker   . Smokeless tobacco: Never Used  . Alcohol Use: No  . Drug Use: No  . Sexual Activity: Not on file   Other Topics Concern  . Not on file   Social History Narrative   Married, 2 sons.   Orig from [place].   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   No T/A/Ds.          Outpatient Encounter Prescriptions as of 09/19/2014  Medication Sig  . amLODipine-olmesartan (AZOR) 10-40 MG per tablet TAKE ONE TABLET BY MOUTH DAILY  . aspirin 81 MG tablet Take 81 mg by mouth daily.    [name] Kitchen atenolol (TENORMIN) 25 MG  tablet TAKE 1 TABLET BY MOUTH ONCE DAILY  . CRESTOR 5 MG tablet TAKE ONE TABLET BY MOUTH DAILY FOR CHOLESTEROL  . dutasteride (AVODART) 0.5 MG capsule Take 0.5 mg by mouth daily.    . Multiple Vitamins-Minerals (OCUVITE ADULT FORMULA PO) Take 1 tablet by mouth daily.      Allergies  Allergen Reactions  . Penicillins     hives    PE; Blood pressure 137/76, pulse 55, temperature 97.8 F (36.6 C), temperature source Temporal, resp. rate 18, height 6\' 1"  (1.854 m), weight 190 lb (86.183 kg), SpO2 95 %. Gen: Alert, well appearing.  Patient is oriented to person, place, time, and situation. XKG:YJEH: no injection, icteris, swelling, or exudate.  EOMI, PERRLA. Mouth: lips without lesion/swelling.  Oral mucosa pink and moist. Oropharynx without erythema, exudate, or swelling.  CV: RRR, no m/r/g.   LUNGS: CTA bilat, nonlabored resps, good aeration in all lung fields. EXT: no clubbing, cyanosis, or edema.   Pertinent labs:  Lab Results  Component Value Date   WBC 6.4 08/03/2014   HGB 14.0 08/03/2014   HCT 42.4  08/03/2014   MCV 92.4 08/03/2014   PLT 199.0 08/03/2014     Chemistry      Component Value Date/Time   NA 140 08/03/2014 0905   K 3.8 08/03/2014 0905   CL 105 08/03/2014 0905   CO2 29 08/03/2014 0905   BUN 30* 08/03/2014 0905   CREATININE 1.2 08/03/2014 0905      Component Value Date/Time   CALCIUM 9.0 08/03/2014 0905   ALKPHOS 52 08/03/2014 0905   AST 21 08/03/2014 0905   ALT 20 08/03/2014 0905   BILITOT 0.8 08/03/2014 0905     Lab Results  Component Value Date   CHOL 107 08/03/2014   HDL 33.60* 08/03/2014   LDLCALC 57 08/03/2014   TRIG 83.0 08/03/2014   CHOLHDL 3 08/03/2014    ASSESSMENT AND PLAN:   Transfer pt/establishing care.  1) HTN; The current medical regimen is effective;  continue present plan and medications.  2) Hyperlipidemia;The current medical regimen is effective;  continue present plan and medications.  3) CAD, hx of CABG, most recent stress testing low risk--scar noted, with associated mild wall motion abnormality, EF normal. Stable, asymptomatic, being followed annually by Dr. Burt Knack.  4) Hx of BPH, well controlled on avodart.  Hx of elevated PSA being followed expectantly by his urologist, Dr. Amalia Hailey.  I'll see him in April 2017, which will be 6 mo after he sees his cardiologist next.  An After Visit Summary was printed and given to the patient.  F/u spring 2017.

## 2014-10-04 ENCOUNTER — Other Ambulatory Visit: Payer: Self-pay

## 2014-11-13 ENCOUNTER — Other Ambulatory Visit: Payer: Self-pay | Admitting: Cardiovascular Disease

## 2014-12-04 DIAGNOSIS — B029 Zoster without complications: Secondary | ICD-10-CM

## 2014-12-04 HISTORY — DX: Zoster without complications: B02.9

## 2014-12-08 ENCOUNTER — Encounter: Payer: Self-pay | Admitting: Family Medicine

## 2014-12-08 ENCOUNTER — Ambulatory Visit (INDEPENDENT_AMBULATORY_CARE_PROVIDER_SITE_OTHER): Payer: Medicare Other | Admitting: Family Medicine

## 2014-12-08 VITALS — BP 120/68 | HR 95 | Temp 98.1°F | Ht 73.0 in | Wt 197.0 lb

## 2014-12-08 DIAGNOSIS — M67432 Ganglion, left wrist: Secondary | ICD-10-CM

## 2014-12-08 NOTE — Progress Notes (Signed)
OFFICE NOTE  12/08/2014  CC: "cyst on left wrist"  HPI: Patient is a 74 y.o. Caucasian male who is here for a couple month hx of gradually enlarging nodule on top of left wrist/hand intersection.  No pain.  No hx of injury/trauma to wrist. Hx of similar lesion in remote past on right wrist that was aspirated and it never returned.    Pertinent PMH:  Past medical, surgical, social, and family history reviewed and no changes are noted since last office visit.  MEDS:  Outpatient Prescriptions Prior to Visit  Medication Sig Dispense Refill  . amLODipine-olmesartan (AZOR) 10-40 MG per tablet TAKE ONE TABLET BY MOUTH DAILY 30 tablet 11  . aspirin 81 MG tablet Take 81 mg by mouth daily.      [name] Kitchen atenolol (TENORMIN) 25 MG tablet TAKE 1 TABLET BY MOUTH ONCE DAILY 30 tablet 9  . CRESTOR 5 MG tablet TAKE ONE TABLET BY MOUTH DAILY FOR CHOLESTEROL 30 tablet 10  . dutasteride (AVODART) 0.5 MG capsule Take 0.5 mg by mouth daily.      . Multiple Vitamins-Minerals (OCUVITE ADULT FORMULA PO) Take 1 tablet by mouth daily.       No facility-administered medications prior to visit.    PE: Blood pressure 120/68, pulse 95, temperature 98.1 F (36.7 C), temperature source Oral, height 6\' 1"  (1.854 m), weight 197 lb (89.359 kg), SpO2 95 %. Gen: Alert, well appearing.  Patient is oriented to person, place, time, and situation. Left wrist with approx 2 cm subQ rubbery, round nodule on dorsal aspect.  Non-tender, no erythema.  It does not move with finger flexion/extension, and it is not pulsatile. +the lesion moves up and down with wrist flexion/extension.    IMPRESSION AND PLAN:  Left wrist ganglion cyst on dorsal aspect (asymptomatic). Discussed options with pt: aspiration in office today vs watchful waiting--return for aspiration in the future prn.  He chose the latter option. Questions answered. Signs/symptoms to call or return for were reviewed and pt expressed understanding.  An After Visit Summary  was printed and given to the patient.  FOLLOW UP: prn

## 2014-12-08 NOTE — Progress Notes (Signed)
Pre visit review using our clinic review tool, if applicable. No additional management support is needed unless otherwise documented below in the visit note. 

## 2014-12-19 ENCOUNTER — Ambulatory Visit (INDEPENDENT_AMBULATORY_CARE_PROVIDER_SITE_OTHER): Payer: Medicare Other | Admitting: Nurse Practitioner

## 2014-12-19 ENCOUNTER — Encounter: Payer: Self-pay | Admitting: Nurse Practitioner

## 2014-12-19 VITALS — BP 137/77 | HR 56 | Temp 97.5°F | Ht 73.0 in | Wt 195.0 lb

## 2014-12-19 DIAGNOSIS — B029 Zoster without complications: Secondary | ICD-10-CM

## 2014-12-19 MED ORDER — VALACYCLOVIR HCL 1 G PO TABS: 1000.0000 mg | ORAL_TABLET | Freq: Three times a day (TID) | ORAL | Status: DC

## 2014-12-19 NOTE — Progress Notes (Signed)
   Subjective:    Patient ID: Russell [name], male    DOB: December 30, 1940, 74 y.o.   MRN: [id number]  HPI Comments: Pt has been having "ice pick" HA R side of head along occiput, parietal & temporal area. Intermittent. Pain is resolving. Still has some "soreness in jaw & behind ear". He went to dentist. Had root canal performed R lower-pt says oral surgeon told him he wasn't 100 % sure sensitivity was due to cavity as Xray looked nml. Pt had started to develop lesions R lower chin day he went to dentist.  Rash This is a new problem. The current episode started 1 to 4 weeks ago (7 days, new lesions 3 days ago). The problem has been gradually improving since onset. The affected locations include the face. The rash is characterized by blistering and redness. He was exposed to nothing. Pertinent negatives include no congestion, cough, eye pain, facial edema, fatigue, fever, rhinorrhea or shortness of breath. (R-sided head pain & jaw pain. No changes in ear & vision.) Past treatments include nothing.      Review of Systems  Constitutional: Negative for fever and fatigue.  HENT: Negative for congestion and rhinorrhea.   Eyes: Negative for photophobia, pain, redness and visual disturbance.  Respiratory: Negative for cough and shortness of breath.   Skin: Positive for rash.       Objective:   Physical Exam  Constitutional: He is oriented to person, place, and time. He appears well-developed and well-nourished. No distress.  HENT:  Head: Normocephalic and atraumatic.  Right Ear: External ear normal.  Left Ear: External ear normal.  Mouth/Throat: Oropharynx is clear and moist. No oropharyngeal exudate.  No lesions in mouth TM vessels injected bilat, bones visible  Eyes: Conjunctivae are normal. Right eye exhibits no discharge. Left eye exhibits no discharge.  Neck: Normal range of motion. Neck supple. No thyromegaly present.  Cardiovascular: Normal rate.   Pulmonary/Chest: Effort normal. No  respiratory distress.  Lymphadenopathy:    He has no cervical adenopathy.  Neurological: He is alert and oriented to person, place, and time.  Skin: Skin is warm and dry. Rash noted.  2 scabs on R chin. Small vesicular lesions corner of R mouth & vermillion border of R lower lip  Psychiatric: He has a normal mood and affect. His behavior is normal. Thought content normal.  Vitals reviewed.         Assessment & Plan:  1. 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  Dispense: 21 tablet; Refill: 0 F/u 6 weeks or sooner if vision or hearing changes

## 2014-12-19 NOTE — Patient Instructions (Addendum)
Start valtrex. Take as directed. You are contagious until lesions have crusted. Apply vaseline or neosporin on lesions for several days.  Please let us know if you have vision  Or hearing changes.  Please follow up in 6 weeks.  Shingles Shingles (herpes zoster) is an infection that is caused by the same virus that causes chickenpox (varicella). The infection causes a painful skin rash and fluid-filled blisters, which eventually break open, crust over, and heal. It may occur in any area of the body, but it usually affects only one side of the body or face. The pain of shingles usually lasts about 1 month. However, some people with shingles may develop long-term (chronic) pain in the affected area of the body. Shingles often occurs many years after the person had chickenpox. It is more common:  In people older than 50 years.  In people with weakened immune systems, such as those with HIV, AIDS, or cancer.  In people taking medicines that weaken the immune system, such as transplant medicines.  In people under great stress. CAUSES  Shingles is caused by the varicella zoster virus (VZV), which also causes chickenpox. After a person is infected with the virus, it can remain in the person's body for years in an inactive state (dormant). To cause shingles, the virus reactivates and breaks out as an infection in a nerve root. The virus can be spread from person to person (contagious) through contact with open blisters of the shingles rash. It will only spread to people who have not had chickenpox. When these people are exposed to the virus, they may develop chickenpox. They will not develop shingles. Once the blisters scab over, the person is no longer contagious and cannot spread the virus to others. SIGNS AND SYMPTOMS  Shingles shows up in stages. The initial symptoms may be pain, itching, and tingling in an area of the skin. This pain is usually described as burning, stabbing, or throbbing.In a  few days or weeks, a painful red rash will appear in the area where the pain, itching, and tingling were felt. The rash is usually on one side of the body in a band or belt-like pattern. Then, the rash usually turns into fluid-filled blisters. They will scab over and dry up in approximately 2-3 weeks. Flu-like symptoms may also occur with the initial symptoms, the rash, or the blisters. These may include:  Fever.  Chills.  Headache.  Upset stomach. DIAGNOSIS  Your health care provider will perform a skin exam to diagnose shingles. Skin scrapings or fluid samples may also be taken from the blisters. This sample will be examined under a microscope or sent to a lab for further testing. TREATMENT  There is no specific cure for shingles. Your health care provider will likely prescribe medicines to help you manage the pain, recover faster, and avoid long-term problems. This may include antiviral drugs, anti-inflammatory drugs, and pain medicines. HOME CARE INSTRUCTIONS   Take a cool bath or apply cool compresses to the area of the rash or blisters as directed. This may help with the pain and itching.   Take medicines only as directed by your health care provider.   Rest as directed by your health care provider.  Keep your rash and blisters clean with mild soap and cool water or as directed by your health care provider.  Do not pick your blisters or scratch your rash. Apply an anti-itch cream or numbing creams to the affected area as directed by your health care provider.  Keep your shingles rash covered with a loose bandage (dressing).  Avoid skin contact with:  Babies.   Pregnant women.   Children with eczema.   Elderly people with transplants.   People with chronic illnesses, such as leukemia or AIDS.   Wear loose-fitting clothing to help ease the pain of material rubbing against the rash.  Keep all follow-up visits as directed by your health care provider.If the area  involved is on your face, you may receive a referral for a specialist, such as an eye doctor (ophthalmologist) or an ear, nose, and throat (ENT) doctor. Keeping all follow-up visits will help you avoid eye problems, chronic pain, or disability.  SEEK IMMEDIATE MEDICAL CARE IF:   You have facial pain, pain around the eye area, or loss of feeling on one side of your face.  You have ear pain or ringing in your ear.  You have loss of taste.  Your pain is not relieved with prescribed medicines.   Your redness or swelling spreads.   You have more pain and swelling.  Your condition is worsening or has changed.   You have a fever. MAKE SURE YOU:  Understand these instructions.  Will watch your condition.  Will get help right away if you are not doing well or get worse. Document Released: 10/20/2005 Document Revised: 03/06/2014 Document Reviewed: 06/03/2012 [facility] Patient Information 2015 [place], [place]. This information is not intended to replace advice given to you by your health care provider. Make sure you discuss any questions you have with your health care provider.

## 2014-12-19 NOTE — Progress Notes (Signed)
Pre visit review using our clinic review tool, if applicable. No additional management support is needed unless otherwise documented below in the visit note. 

## 2015-01-24 ENCOUNTER — Encounter: Payer: Self-pay | Admitting: Nurse Practitioner

## 2015-01-24 ENCOUNTER — Ambulatory Visit (INDEPENDENT_AMBULATORY_CARE_PROVIDER_SITE_OTHER): Payer: Medicare Other | Admitting: Nurse Practitioner

## 2015-01-24 VITALS — BP 118/64 | HR 55 | Temp 97.9°F | Ht 73.0 in | Wt 196.0 lb

## 2015-01-24 DIAGNOSIS — B029 Zoster without complications: Secondary | ICD-10-CM | POA: Insufficient documentation

## 2015-01-24 NOTE — Progress Notes (Signed)
Pre visit review using our clinic review tool, if applicable. No additional management support is needed unless otherwise documented below in the visit note. 

## 2015-01-24 NOTE — Progress Notes (Signed)
Subjective:     Patient ID: Russell [name] is a 74 y.o. male presents for follow up of shingles. He was seen 6 weeks ago with rash on R side of chin & upper lip, stabbing R sided HA. He completed 7 day course valtrex. Today he states symptoms have resolved. He has post-inflamm change from 2 lesions on chin. Lower lesion left a pitted scar.  He received zoster vaccine 5 yrs ago.  The following portions of the patient's history were reviewed and updated as appropriate: allergies, current medications, past medical history, past social history, past surgical history and problem list.  Review of Systems Pertinent items are noted in HPI.    Objective:    BP 118/64 mmHg  Pulse 55  Temp(Src) 97.9 F (36.6 C) (Oral)  Ht 6\' 1"  (1.854 m)  Wt 196 lb (88.905 kg)  BMI 25.86 kg/m2  SpO2 95% BP 118/64 mmHg  Pulse 55  Temp(Src) 97.9 F (36.6 C) (Oral)  Ht 6\' 1"  (1.854 m)  Wt 196 lb (88.905 kg)  BMI 25.86 kg/m2  SpO2 95% General appearance: alert, cooperative, appears stated age and no distress Head: Normocephalic, without obvious abnormality, atraumatic Eyes: negative findings: lids and lashes normal, conjunctivae and sclerae normal and wearing glasses Ears: normal TM's and external ear canals both ears Neck: no adenopathy and thyroid not enlarged, symmetric, no tenderness/mass/nodules Lungs: clear to auscultation bilaterally Heart: regular rate and rhythm, S1, S2 normal, no murmur, click, rub or gallop Skin: pittied scar R lower chin, purplish discoloration. Upper lesion R chin -no pitting, purplish discoloration. Neurologic: Grossly normal    Assessment:Plan   1. Shingles Post-inflam change chin. All pain resolved & lesions healed. F/u PRN

## 2015-01-24 NOTE — Patient Instructions (Signed)
Protect healing lesion with sun screen You may use mederma to minimze scarring. Color will gradually fade. It will take several months, up to a year.  Very nice to see you!  Happy Eatser!

## 2015-01-31 ENCOUNTER — Other Ambulatory Visit: Payer: Self-pay

## 2015-01-31 MED ORDER — ROSUVASTATIN CALCIUM 5 MG PO TABS: ORAL_TABLET | ORAL | Status: DC

## 2015-01-31 MED ORDER — AMLODIPINE-OLMESARTAN 10-40 MG PO TABS: ORAL_TABLET | ORAL | Status: DC

## 2015-02-01 ENCOUNTER — Ambulatory Visit: Payer: Medicare Other | Admitting: Nurse Practitioner

## 2015-04-07 ENCOUNTER — Encounter: Payer: Self-pay | Admitting: Cardiovascular Disease

## 2015-04-24 ENCOUNTER — Encounter: Payer: Self-pay | Admitting: Gastroenterology

## 2015-05-05 ENCOUNTER — Encounter: Payer: Self-pay | Admitting: Cardiovascular Disease

## 2015-05-21 ENCOUNTER — Encounter: Payer: Self-pay | Admitting: Cardiovascular Disease

## 2015-05-25 ENCOUNTER — Other Ambulatory Visit: Payer: Self-pay

## 2015-05-25 DIAGNOSIS — I10 Essential (primary) hypertension: Secondary | ICD-10-CM

## 2015-05-25 DIAGNOSIS — I2510 Atherosclerotic heart disease of native coronary artery without angina pectoris: Secondary | ICD-10-CM

## 2015-08-01 ENCOUNTER — Encounter: Payer: Self-pay | Admitting: Family Medicine

## 2015-08-01 ENCOUNTER — Ambulatory Visit (INDEPENDENT_AMBULATORY_CARE_PROVIDER_SITE_OTHER): Payer: Medicare Other | Admitting: Family Medicine

## 2015-08-01 ENCOUNTER — Ambulatory Visit: Payer: Medicare Other | Admitting: Family Medicine

## 2015-08-01 VITALS — BP 159/78 | HR 51 | Temp 97.5°F | Resp 16 | Ht 73.0 in | Wt 188.0 lb

## 2015-08-01 DIAGNOSIS — I2510 Atherosclerotic heart disease of native coronary artery without angina pectoris: Secondary | ICD-10-CM

## 2015-08-01 DIAGNOSIS — R51 Headache: Secondary | ICD-10-CM | POA: Diagnosis not present

## 2015-08-01 DIAGNOSIS — L989 Disorder of the skin and subcutaneous tissue, unspecified: Secondary | ICD-10-CM

## 2015-08-01 DIAGNOSIS — R519 Headache, unspecified: Secondary | ICD-10-CM

## 2015-08-01 DIAGNOSIS — Z8619 Personal history of other infectious and parasitic diseases: Secondary | ICD-10-CM

## 2015-08-01 NOTE — Progress Notes (Signed)
Pre visit review using our clinic review tool, if applicable. No additional management support is needed unless otherwise documented below in the visit note. 

## 2015-08-01 NOTE — Progress Notes (Signed)
OFFICE NOTE  08/01/2015  CC:  Chief Complaint  Patient presents with  . Rash    spot on left shoulder  . Headache   HPI: Patient is a 74 y.o. Caucasian male who is here for 1-2 days of sharp HA on R parietal/vertex area---intermittent stabbing pains. These remind him of the HA's he had at the time of his dx of shingles 12/2014. Also 2 d/a had a case of the shaking chills for about a minute or two.  Temp not checked.  No malaise or fatigue. No lesions on chin where he had shingles 12/2014, but noted R shoulder skin lesion come up about 2-3 days ago--it is a bit itchy but not painful/burning/tingly.   No other skin lesions have developed. No lesions on lips or in mouth.  No focal weakness.  No dizziness.  Pertinent PMH:  Past medical, surgical, social, and family history reviewed and no changes are noted since last office visit. (Shingles on chin 12/2014)  MEDS:  Outpatient Prescriptions Prior to Visit  Medication Sig Dispense Refill  . amLODipine-olmesartan (AZOR) 10-40 MG per tablet TAKE ONE TABLET BY MOUTH DAILY 90 tablet 2  . aspirin 81 MG tablet Take 81 mg by mouth daily.      [name] Kitchen atenolol (TENORMIN) 25 MG tablet TAKE 1 TABLET BY MOUTH ONCE DAILY 30 tablet 9  . rosuvastatin (CRESTOR) 5 MG tablet TAKE ONE TABLET BY MOUTH DAILY FOR CHOLESTEROL 90 tablet 2  . dutasteride (AVODART) 0.5 MG capsule Take 0.5 mg by mouth daily.      . Multiple Vitamins-Minerals (OCUVITE ADULT FORMULA PO) Take 1 tablet by mouth daily.       No facility-administered medications prior to visit.    PE: Blood pressure 159/78, pulse 51, temperature 97.5 F (36.4 C), temperature source Oral, resp. rate 16, height 6\' 1"  (1.854 m), weight 188 lb (85.276 kg), SpO2 94 %. Gen: Alert, well appearing.  Patient is oriented to person, place, time, and situation. ENT: Ears: EACs clear, normal epithelium.  TMs with good light reflex and landmarks bilaterally.  Eyes: no injection, icteris, swelling, or exudate.  EOMI,  PERRLA. Nose: no drainage or turbinate edema/swelling.  No injection or focal lesion.  Mouth: lips without lesion/swelling.  Oral mucosa pink and moist.  Dentition intact and without obvious caries or gingival swelling.  Oropharynx without erythema, exudate, or swelling.  Neck - No masses or thyromegaly or limitation in range of motion SKIN: posterior aspect of R shoulder has a pink splotch about the size of a dime with poorly demarcated borders.  Use of otoscope magnification reveals what may be a couple of small vesicular lesions in center of pink splotch vs flat-type papules.  One punctate skin opening noted but no drainage.  No fluctuance or significant induration.  The lesion is nontender and not hypersensitive to touch.  IMPRESSION AND PLAN:  Skin lesion R shoulder, with some HA's and recent chills that are making the patient wonder if he is having another bout of herpes zoster.  Given the fact that the lesion is not painful AND it is an isolated lesion, I think he more likely has an insect bite.  If he does have shingles, it is not showing signs of progressing since the appearance of this lone skin lesion 2-3 days ago, so I think watchful waiting approach should be taken--no empiric antiviral treatment or steroids.  He is in agreement with this approach.  Fortunately he appears very well today. Signs/symptoms to call or return  for were reviewed and pt expressed understanding.  An After Visit Summary was printed and given to the patient.  FOLLOW UP: prn

## 2015-08-09 ENCOUNTER — Other Ambulatory Visit (INDEPENDENT_AMBULATORY_CARE_PROVIDER_SITE_OTHER): Payer: Medicare Other

## 2015-08-09 DIAGNOSIS — I10 Essential (primary) hypertension: Secondary | ICD-10-CM | POA: Diagnosis not present

## 2015-08-09 LAB — LIPID PANEL
CHOL/HDL RATIO: 3.2 ratio (ref <=5.0)
CHOLESTEROL: 101 mg/dL — AB (ref 125–200)
HDL: 32 mg/dL — AB (ref >=40)
LDL Cholesterol: 56 mg/dL (ref <130)
TRIGLYCERIDES: 67 mg/dL (ref <150)
VLDL: 13 mg/dL (ref <30)

## 2015-08-09 LAB — HEPATIC FUNCTION PANEL
ALBUMIN: 4.2 g/dL (ref 3.6–5.1)
ALT: 16 U/L (ref 9–46)
AST: 18 U/L (ref 10–35)
Alkaline Phosphatase: 48 U/L (ref 40–115)
BILIRUBIN INDIRECT: 0.5 mg/dL (ref 0.2–1.2)
Bilirubin, Direct: 0.2 mg/dL (ref <=0.2)
TOTAL PROTEIN: 6.7 g/dL (ref 6.1–8.1)
Total Bilirubin: 0.7 mg/dL (ref 0.2–1.2)

## 2015-08-09 LAB — BASIC METABOLIC PANEL
BUN: 29 mg/dL — ABNORMAL HIGH (ref 7–25)
CO2: 29 mmol/L (ref 20–31)
Calcium: 9.2 mg/dL (ref 8.6–10.3)
Chloride: 106 mmol/L (ref 98–110)
Creat: 1.05 mg/dL (ref 0.70–1.18)
Glucose, Bld: 88 mg/dL (ref 65–99)
POTASSIUM: 3.9 mmol/L (ref 3.5–5.3)
SODIUM: 141 mmol/L (ref 135–146)

## 2015-08-09 LAB — CBC
HCT: 40.2 % (ref 39.0–52.0)
Hemoglobin: 14.3 g/dL (ref 13.0–17.0)
MCH: 31.4 pg (ref 26.0–34.0)
MCHC: 35.6 g/dL (ref 30.0–36.0)
MCV: 88.2 fL (ref 78.0–100.0)
MPV: 10.1 fL (ref 8.6–12.4)
PLATELETS: 175 10*3/uL (ref 150–400)
RBC: 4.56 MIL/uL (ref 4.22–5.81)
RDW: 13.6 % (ref 11.5–15.5)
WBC: 6 10*3/uL (ref 4.0–10.5)

## 2015-08-09 NOTE — Addendum Note (Signed)
Addended by: Velna Ochs on: 08/09/2015 08:29 AM   Modules accepted: Orders

## 2015-08-13 ENCOUNTER — Ambulatory Visit (INDEPENDENT_AMBULATORY_CARE_PROVIDER_SITE_OTHER): Payer: Medicare Other

## 2015-08-13 ENCOUNTER — Ambulatory Visit (INDEPENDENT_AMBULATORY_CARE_PROVIDER_SITE_OTHER): Payer: Medicare Other | Admitting: Cardiovascular Disease

## 2015-08-13 ENCOUNTER — Encounter: Payer: Self-pay | Admitting: Cardiovascular Disease

## 2015-08-13 ENCOUNTER — Encounter (INDEPENDENT_AMBULATORY_CARE_PROVIDER_SITE_OTHER): Payer: Medicare Other | Admitting: Cardiovascular Disease

## 2015-08-13 VITALS — BP 140/80 | HR 72 | Ht 73.0 in | Wt 186.4 lb

## 2015-08-13 DIAGNOSIS — I10 Essential (primary) hypertension: Secondary | ICD-10-CM

## 2015-08-13 DIAGNOSIS — I2510 Atherosclerotic heart disease of native coronary artery without angina pectoris: Secondary | ICD-10-CM

## 2015-08-13 DIAGNOSIS — Z23 Encounter for immunization: Secondary | ICD-10-CM

## 2015-08-13 LAB — EXERCISE TOLERANCE TEST
CHL CUP MPHR: 147 {beats}/min
CHL RATE OF PERCEIVED EXERTION: 13
CSEPEDS: 0 s
CSEPEW: 10.1 METS
CSEPHR: 112 %
CSEPPHR: 164 {beats}/min
Exercise duration (min): 9 min
Rest HR: 58 {beats}/min

## 2015-08-13 MED ORDER — OLMESARTAN MEDOXOMIL 40 MG PO TABS: 40.0000 mg | ORAL_TABLET | Freq: Every day | ORAL | Status: DC

## 2015-08-13 MED ORDER — AMLODIPINE BESYLATE 10 MG PO TABS: 10.0000 mg | ORAL_TABLET | Freq: Every day | ORAL | Status: DC

## 2015-08-13 NOTE — Patient Instructions (Addendum)
Medication Instructions:  Your physician has recommended you make the following change in your medication:  1. CHANGE Azor to Amlodipine 10mg  take one by mouth daily and Olmesartan 40mg  one by mouth daily  Labwork: No new orders.   Testing/Procedures: No new orders.   Follow-Up: Your physician wants you to follow-up in: 1 YEAR with Dr Burt Knack.  You will receive a reminder letter in the mail two months in advance. If you don't receive a letter, please call our office to schedule the follow-up appointment.   Any Other Special Instructions Will Be Listed Below (If Applicable).

## 2015-08-13 NOTE — Progress Notes (Signed)
Cardiology Office Note Date:  08/13/2015   ID:  Russell, [name] 06/29/1941, MRN [id number]  PCP:  Russell Sou, MD  Cardiologist:  Russell Mocha, MD    Chief Complaint  Patient presents with  . Follow-up    CAD    History of Present Illness: Russell [name] is a 74 y.o. male who presents for follow-up evaluation. The patient is followed for coronary artery disease. He underwent coronary bypass grafting in 2003. He was treated with a LIMA to LAD, saphenous vein graft to diagonal, saphenous vein graft to OM1, and sequential saphenous vein graft to second and third OM branches. He's been followed with annual stress testing per [facility] requirements. We have alternated exercise treadmill ECG testing and nuclear stress testing on a year to year basis. This is done in concordance with FAA guidelines.   The patient is doing well. Blood pressure is been well controlled at home. He continues to exercise regularly without exertional symptoms. No chest pain, chest pressure, or shortness of breath. He denies lightheadedness, palpitations, edema, orthopnea, or PND. He reports no changes since his last visit. Azor has become nonformulary and now is very expensive. He would like to change to an alternative blood pressure medicine.   Past Medical History  Diagnosis Date  . Hypertension   . Hyperlipidemia   . CAD (coronary artery disease) 2003  . Ulcer 1981  . Blood transfusion 2003  . Adenomatous colon polyp 2005  . BPH (benign prostatic hypertrophy)   . Chronic renal insufficiency, stage II (mild)     CrCl 60s  . Elevated PSA     watchful waiting approach being taken by pt; most recent PSA was done in 12/2013 by his urologist at [facility], Russell. Amalia [name] and this number was slightly improved: [name] is for repeat in 1 yr with Russell. Amalia [name].  [name] Kitchen Herpes zoster 12/2014    R side of chin (V3 area)    Past Surgical History  Procedure Laterality Date  . Coronary artery bypass graft  2003    5 vessel (Russell.  Cyndia [name])  . Hemorrhoid surgery      Russell. Harlow [name]  . Lumbar laminectomy      Russell. Quincy [name]  . Ganglion cystectomy       hand  . Colonoscopy      12/2011 'tics, int hem, o/w NORMAL.  Recall 5 yrs.  Negative 2008, polyp 2005; Russell [name]  . Flexible sigmoidoscopy      1992, 1997  . Inguinal hernia repair Left   . Basal cell carcinoma excision  04/2011    Mohs , Russell Russell [name]  . Cardiovascular stress test  08/2014    Low risk: no ischemia.  Inferior/inferolateral scar and slight decreased mobility of LV wall in this region, EF normal.  . Biopsy prostate  approx 2010    Russell. Amalia [name].    Current Outpatient Prescriptions  Medication Sig Dispense Refill  . amLODipine-olmesartan (AZOR) 10-40 MG per tablet TAKE ONE TABLET BY MOUTH DAILY 90 tablet 2  . aspirin 81 MG tablet Take 81 mg by mouth daily.      [name] Kitchen atenolol (TENORMIN) 25 MG tablet TAKE 1 TABLET BY MOUTH ONCE DAILY 30 tablet 9  . rosuvastatin (CRESTOR) 5 MG tablet TAKE ONE TABLET BY MOUTH DAILY FOR CHOLESTEROL 90 tablet 2   No current facility-administered medications for this visit.    Allergies:   Penicillins   Social History:  The patient  reports that he has never smoked. He has  never used smokeless tobacco. He reports that he does not drink alcohol or use illicit drugs.   Family History:  The patient's  family history includes Aneurysm (age of onset: 67) in his mother; Heart attack (age of onset: 50) in his father; Heart attack (age of onset: 74) in his brother. There is no history of Colon cancer, Esophageal cancer, Rectal cancer, or Stomach cancer.    ROS:  Please see the history of present illness.  All other systems are reviewed and negative.   PHYSICAL EXAM: VS:  BP 140/80 mmHg  Pulse 72  Ht 6\' 1"  (1.854 m)  Wt 186 lb 6.4 oz (84.55 kg)  BMI 24.60 kg/m2  SpO2 96% , BMI Body mass index is 24.6 kg/(m^2). GEN: Well nourished, well developed, in no acute distress HEENT: normal Neck: no JVD, no masses. No carotid bruits Cardiac: RRR  without murmur or gallop                Respiratory:  clear to auscultation bilaterally, normal work of breathing GI: soft, nontender, nondistended, + BS MS: no deformity or atrophy Ext: no pretibial edema, pedal pulses 2+= bilaterally Skin: warm and dry, no rash Neuro:  Strength and sensation are intact Psych: euthymic mood, full affect  EKG:  EKG is ordered today. The ekg ordered today shows NSR, within normal limits  Recent Labs: 08/09/2015: ALT 16; BUN 29*; Creat 1.05; Hemoglobin 14.3; Platelets 175; Potassium 3.9; Sodium 141   Lipid Panel     Component Value Date/Time   CHOL 101* 08/09/2015 0829   TRIG 67 08/09/2015 0829   HDL 32* 08/09/2015 0829   CHOLHDL 3.2 08/09/2015 0829   VLDL 13 08/09/2015 0829   LDLCALC 56 08/09/2015 0829      Wt Readings from Last 3 Encounters:  08/13/15 186 lb 6.4 oz (84.55 kg)  08/01/15 188 lb (85.276 kg)  01/24/15 196 lb (88.905 kg)     Cardiac Studies Reviewed: Myoview Stress Test: 08-03-2014: QPS Raw Data Images: Normal; no motion artifact; normal heart/lung ratio. Stress Images: There is a large area of mild decreased uptake of affecting the base/mid/apical inferior segments, base/mid inferolateral segments, and apical lateral segment. Rest Images: The rest images are the same as the stress images. Subtraction (SDS): No evidence of ischemia. Transient Ischemic Dilatation (Normal <1.22): 0.92 Lung/Heart Ratio (Normal <0.45): 0.27  Quantitative Gated Spect Images QGS EDV: 119 ml QGS ESV: 57 ml  Impression Exercise Capacity: Excellent exercise capacity. BP Response: Normal blood pressure response. Clinical Symptoms: No symptoms. ECG Impression: No significant ST segment change suggestive of ischemia. Comparison with Prior Nuclear Study; The study is compared with the report of the study from September, 2012  Overall Impression: The study is abnormal, but unchanged from September, 2012. There is mild scar affecting the  inferior wall and inferolateral wall. There is no ischemia. This is a low risk scan.  LV Ejection Fraction: 52%. LV Wall Motion: There is very slight decreased motion of the inferior wall.  ASSESSMENT AND [name]: 1.  CAD, native vessel, without symptoms of angina: The patient shows continued stability with respect to his coronary artery disease. His nuclear scan last year showed no change from previous studies. It was interpreted as a "low risk scan." His treadmill study is administered today and he continues to have good exercise tolerance without any ischemic symptoms. He did have 1-2 mm of upsloping ST segment depression at peak exercise. However, this resolved very rapidly in recovery and I would also interpret  this as a low risk study. I do not think any further testing is indicated at this time. I will [name] on seeing him back next year.  2. Essential hypertension: Blood pressure borderline today but patient is off of his beta blocker for 48 hours. Will continue current antihypertensives medicines, but will change to the individual components of Azor as this medicine has become cost prohibitive. Home blood pressure readings have been in range.  3. Hyperlipidemia: Recent lipids as above. He is at goal with LDL less than 70 mg/dL.   Current medicines are reviewed with the patient today.  The patient does not have concerns regarding medicines.  Labs/ tests ordered today include:  No orders of the defined types were placed in this encounter.    Disposition:   FU one year  Signed, Russell Mocha, MD  08/13/2015 12:02 PM    [address], [place], [place]  [address] Phone: [phone]; Fax: [phone]

## 2015-08-14 ENCOUNTER — Encounter: Payer: Self-pay | Admitting: Cardiovascular Disease

## 2015-08-14 ENCOUNTER — Other Ambulatory Visit: Payer: Self-pay

## 2015-08-14 MED ORDER — VALSARTAN 320 MG PO TABS: 320.0000 mg | ORAL_TABLET | Freq: Every day | ORAL | Status: DC

## 2015-09-16 ENCOUNTER — Encounter: Payer: Self-pay | Admitting: Cardiovascular Disease

## 2015-09-17 ENCOUNTER — Telehealth: Payer: Self-pay | Admitting: *Deleted

## 2015-09-17 ENCOUNTER — Other Ambulatory Visit (INDEPENDENT_AMBULATORY_CARE_PROVIDER_SITE_OTHER): Payer: Medicare Other | Admitting: *Deleted

## 2015-09-17 ENCOUNTER — Telehealth: Payer: Self-pay | Admitting: Cardiovascular Disease

## 2015-09-17 DIAGNOSIS — I2510 Atherosclerotic heart disease of native coronary artery without angina pectoris: Secondary | ICD-10-CM

## 2015-09-17 NOTE — Telephone Encounter (Signed)
I spoke with the pt and made him aware that I have printed out a copy of 08/13/15 stress test interpretation for the pt to pick up at front desk.  I have placed an order for GFR to be drawn today per the pt's request.  The pt will address PSA with Urology.

## 2015-09-17 NOTE — Telephone Encounter (Signed)
New message      Talk to [place]. Please see note on my chart.  He said it was complicated/  Please call

## 2015-09-17 NOTE — Telephone Encounter (Signed)
Pt called and wants to know if Dr. Anitra Lauth will give a status report of pts elevated PSA. He stated that he is trying to get in with Dr. Amalia Hailey office for this but has not heard back. He just wanted to "cover his bases" and see if Dr. Anitra Lauth could do this if he is unable to get in with Dr. Amalia Hailey. He is going to fax over a form stating what he will need. Please advise. Thanks.

## 2015-09-17 NOTE — Telephone Encounter (Signed)
Pt is going to go to urology instead.  Please disregard.

## 2015-09-18 NOTE — Telephone Encounter (Signed)
Noted  

## 2015-09-19 ENCOUNTER — Encounter: Payer: Self-pay | Admitting: Cardiovascular Disease

## 2015-09-19 LAB — CREATININE, SERUM: CREATININE: 1.17 mg/dL (ref 0.70–1.18)

## 2015-09-19 LAB — ESTIMATED GFR
GFR, EST AFRICAN AMERICAN: 71 mL/min (ref >=60)
GFR, EST NON AFRICAN AMERICAN: 61 mL/min (ref >=60)

## 2015-10-08 ENCOUNTER — Other Ambulatory Visit: Payer: Self-pay | Admitting: Cardiovascular Disease

## 2015-10-31 ENCOUNTER — Encounter: Payer: Self-pay | Admitting: Cardiovascular Disease

## 2016-02-29 ENCOUNTER — Other Ambulatory Visit: Payer: Self-pay | Admitting: Cardiovascular Disease

## 2016-04-04 ENCOUNTER — Ambulatory Visit: Payer: Medicare Other | Admitting: Family Medicine

## 2016-04-14 ENCOUNTER — Ambulatory Visit: Payer: Medicare Other | Admitting: Family Medicine

## 2016-05-20 ENCOUNTER — Encounter: Payer: Self-pay | Admitting: Cardiovascular Disease

## 2016-07-16 ENCOUNTER — Other Ambulatory Visit: Payer: Self-pay | Admitting: Cardiovascular Disease

## 2016-07-16 ENCOUNTER — Encounter: Payer: Self-pay | Admitting: Cardiovascular Disease

## 2016-07-16 DIAGNOSIS — I2510 Atherosclerotic heart disease of native coronary artery without angina pectoris: Secondary | ICD-10-CM

## 2016-07-18 ENCOUNTER — Other Ambulatory Visit: Payer: Self-pay

## 2016-07-18 DIAGNOSIS — I2510 Atherosclerotic heart disease of native coronary artery without angina pectoris: Secondary | ICD-10-CM

## 2016-07-18 DIAGNOSIS — I10 Essential (primary) hypertension: Secondary | ICD-10-CM

## 2016-07-20 ENCOUNTER — Encounter: Payer: Self-pay | Admitting: Cardiovascular Disease

## 2016-07-21 ENCOUNTER — Other Ambulatory Visit: Payer: Self-pay

## 2016-07-21 DIAGNOSIS — I10 Essential (primary) hypertension: Secondary | ICD-10-CM

## 2016-07-21 DIAGNOSIS — I2510 Atherosclerotic heart disease of native coronary artery without angina pectoris: Secondary | ICD-10-CM

## 2016-07-29 ENCOUNTER — Other Ambulatory Visit: Payer: Self-pay | Admitting: Cardiovascular Disease

## 2016-08-13 ENCOUNTER — Other Ambulatory Visit: Payer: Self-pay | Admitting: Cardiovascular Disease

## 2016-09-06 ENCOUNTER — Other Ambulatory Visit: Payer: Self-pay | Admitting: Cardiovascular Disease

## 2016-09-22 ENCOUNTER — Encounter: Payer: Self-pay | Admitting: Cardiovascular Disease

## 2016-10-01 ENCOUNTER — Telehealth (HOSPITAL_COMMUNITY): Payer: Self-pay | Admitting: *Deleted

## 2016-10-01 NOTE — Telephone Encounter (Signed)
Left message on voicemail per DPR in reference to upcoming appointment scheduled on 10/06/16 with detailed instructions given per Myocardial Perfusion Study Information Sheet for the test. LM to arrive 15 minutes early, and that it is imperative to arrive on time for appointment to keep from having the test rescheduled. If you need to cancel or reschedule your appointment, please call the office within 24 hours of your appointment. Failure to do so may result in a cancellation of your appointment, and a $50 no show fee. Phone number given for call back for any questions. Kirstie Peri

## 2016-10-06 ENCOUNTER — Ambulatory Visit (HOSPITAL_COMMUNITY): Payer: Medicare Other | Attending: Cardiology

## 2016-10-06 ENCOUNTER — Other Ambulatory Visit (INDEPENDENT_AMBULATORY_CARE_PROVIDER_SITE_OTHER): Payer: Medicare Other

## 2016-10-06 DIAGNOSIS — I2510 Atherosclerotic heart disease of native coronary artery without angina pectoris: Secondary | ICD-10-CM

## 2016-10-06 DIAGNOSIS — I10 Essential (primary) hypertension: Secondary | ICD-10-CM

## 2016-10-06 LAB — HEPATIC FUNCTION PANEL
ALBUMIN: 4.3 g/dL (ref 3.6–5.1)
ALK PHOS: 54 U/L (ref 40–115)
ALT: 16 U/L (ref 9–46)
AST: 23 U/L (ref 10–35)
BILIRUBIN INDIRECT: 0.6 mg/dL (ref 0.2–1.2)
BILIRUBIN TOTAL: 0.7 mg/dL (ref 0.2–1.2)
Bilirubin, Direct: 0.1 mg/dL (ref <=0.2)
Total Protein: 6.6 g/dL (ref 6.1–8.1)

## 2016-10-06 LAB — CBC
HCT: 44.2 % (ref 38.5–50.0)
HEMOGLOBIN: 15 g/dL (ref 13.2–17.1)
MCH: 30.8 pg (ref 27.0–33.0)
MCHC: 33.9 g/dL (ref 32.0–36.0)
MCV: 90.8 fL (ref 80.0–100.0)
MPV: 10.1 fL (ref 7.5–12.5)
Platelets: 191 10*3/uL (ref 140–400)
RBC: 4.87 MIL/uL (ref 4.20–5.80)
RDW: 14 % (ref 11.0–15.0)
WBC: 5.6 10*3/uL (ref 3.8–10.8)

## 2016-10-06 LAB — LIPID PANEL
Cholesterol: 126 mg/dL (ref <200)
HDL: 36 mg/dL — ABNORMAL LOW (ref >40)
LDL CALC: 72 mg/dL (ref <100)
Total CHOL/HDL Ratio: 3.5 Ratio (ref <5.0)
Triglycerides: 90 mg/dL (ref <150)
VLDL: 18 mg/dL (ref <30)

## 2016-10-06 LAB — BASIC METABOLIC PANEL WITH GFR
BUN: 31 mg/dL — ABNORMAL HIGH (ref 7–25)
CHLORIDE: 106 mmol/L (ref 98–110)
CO2: 25 mmol/L (ref 20–31)
CREATININE: 0.92 mg/dL (ref 0.70–1.18)
Calcium: 9.3 mg/dL (ref 8.6–10.3)
GFR, Est African American: >89 mL/min (ref >=60)
GFR, Est Non African American: 81 mL/min (ref >=60)
Glucose, Bld: 90 mg/dL (ref 65–99)
Potassium: 4.5 mmol/L (ref 3.5–5.3)
SODIUM: 142 mmol/L (ref 135–146)

## 2016-10-06 LAB — MYOCARDIAL PERFUSION IMAGING
CHL CUP NUCLEAR SDS: 2
CHL CUP NUCLEAR SRS: 9
CHL CUP RESTING HR STRESS: 61 {beats}/min
CSEPED: 9 min
CSEPEW: 10.7 METS
CSEPHR: 106 %
CSEPPHR: 155 {beats}/min
Exercise duration (sec): 24 s
LV dias vol: 112 mL (ref 62–150)
LV sys vol: 52 mL
MPHR: 146 {beats}/min
RATE: 0.25
SSS: 11
TID: 0.88

## 2016-10-06 MED ORDER — TECHNETIUM TC 99M TETROFOSMIN IV KIT
10.2000 | PACK | Freq: Once | INTRAVENOUS | Status: AC | PRN
  Administered 2016-10-06: 10.2 via INTRAVENOUS
  Filled 2016-10-06: qty 11

## 2016-10-06 MED ORDER — TECHNETIUM TC 99M TETROFOSMIN IV KIT
32.6000 | PACK | Freq: Once | INTRAVENOUS | Status: AC | PRN
  Administered 2016-10-06: 32.6 via INTRAVENOUS
  Filled 2016-10-06: qty 33

## 2016-10-13 ENCOUNTER — Other Ambulatory Visit: Payer: Self-pay | Admitting: Cardiovascular Disease

## 2016-10-13 DIAGNOSIS — I2510 Atherosclerotic heart disease of native coronary artery without angina pectoris: Secondary | ICD-10-CM

## 2016-10-20 ENCOUNTER — Encounter: Payer: Self-pay | Admitting: Cardiovascular Disease

## 2016-10-20 ENCOUNTER — Ambulatory Visit (INDEPENDENT_AMBULATORY_CARE_PROVIDER_SITE_OTHER): Payer: Medicare Other | Admitting: Cardiovascular Disease

## 2016-10-20 VITALS — BP 130/60 | HR 48 | Ht 71.75 in | Wt 186.8 lb

## 2016-10-20 DIAGNOSIS — E782 Mixed hyperlipidemia: Secondary | ICD-10-CM | POA: Diagnosis not present

## 2016-10-20 DIAGNOSIS — R001 Bradycardia, unspecified: Secondary | ICD-10-CM | POA: Diagnosis not present

## 2016-10-20 DIAGNOSIS — I2510 Atherosclerotic heart disease of native coronary artery without angina pectoris: Secondary | ICD-10-CM | POA: Diagnosis not present

## 2016-10-20 NOTE — Patient Instructions (Signed)
Medication Instructions:  Your physician recommends that you continue on your current medications as directed. Please refer to the Current Medication list given to you today.  Labwork: No new orders.   Testing/Procedures: No new orders.   Follow-Up: Your physician wants you to follow-up in: 1 YEAR with Dr Burt Knack.  You will receive a reminder letter in the mail two months in advance. If you don't receive a letter, please call our office to schedule the follow-up appointment.  Please contact the office in regards to [facility] renewal requirements in 1 YEAR.    Any Other Special Instructions Will Be Listed Below (If Applicable).     If you need a refill on your cardiac medications before your next appointment, please call your pharmacy.

## 2016-10-20 NOTE — Progress Notes (Signed)
Cardiology Office Note Date:  10/20/2016   ID:  QUINNTEN BITER, DOB 1941/08/01, MRN [id number]  PCP:  Tammi Sou, MD  Cardiologist:  Sherren Mocha, MD    Chief Complaint  Patient presents with  . Coronary Artery Disease     History of Present Illness: Russell [name] is a 75 y.o. male who presents for  follow-up evaluation. The patient is followed for coronary artery disease. He underwent coronary bypass grafting in 2003. He was treated with a LIMA to LAD, saphenous vein graft to diagonal, saphenous vein graft to OM1, and sequential saphenous vein graft to second and third OM branches. He's been followed with annual stress testing per [facility] requirements. We have alternated exercise treadmill ECG testing and nuclear stress testing on a year to year basis. This is done in concordance with FAA guidelines. This year he underwent a nuclear scan as outlined below.  The patient continues to do well from a symptomatic perspective. He is active with regular exercise and work.Today, he denies symptoms of palpitations, chest pain, shortness of breath, orthopnea, PND, lower extremity edema, dizziness, or syncope.  He is exercising regularly without exertional symptoms.  Past Medical History:  Diagnosis Date  . Adenomatous colon polyp 2005  . Blood transfusion 2003  . BPH (benign prostatic hypertrophy)   . CAD (coronary artery disease) 2003  . Chronic renal insufficiency, stage II (mild)    CrCl 60s  . Elevated PSA    watchful waiting approach being taken by pt; most recent PSA was done in 12/2013 by his urologist at [facility], Dr. Amalia Hailey and this number was slightly improved: plan is for repeat in 1 yr with Dr. Amalia Hailey.  [name] Kitchen Herpes zoster 12/2014   R side of chin (V3 area)  . Hyperlipidemia   . Hypertension   . Ulcer ([place]) 1981    Past Surgical History:  Procedure Laterality Date  . BASAL CELL CARCINOMA EXCISION  04/2011   Mohs , Dr Harvel Quale  . BIOPSY PROSTATE  approx 2010   Dr. Amalia Hailey.  [name] Kitchen  CARDIOVASCULAR STRESS TEST  08/2014   Low risk: no ischemia.  Inferior/inferolateral scar and slight decreased mobility of LV wall in this region, EF normal.  . COLONOSCOPY     12/2011 'tics, int hem, o/w NORMAL.  Recall 5 yrs.  Negative 2008, polyp 2005; Dr Fuller Plan  . CORONARY ARTERY BYPASS GRAFT  2003   5 vessel (Dr. Cyndia Bent)  . [place]  . ganglion cystectomy      hand  . HEMORRHOID SURGERY     Dr. Harlow Asa  . INGUINAL HERNIA REPAIR Left   . LUMBAR LAMINECTOMY     Dr. Quincy Carnes    Current Outpatient Prescriptions  Medication Sig Dispense Refill  . amLODipine (NORVASC) 10 MG tablet Take 1 tablet (10 mg total) by mouth daily. 30 tablet 0  . aspirin 81 MG tablet Take 81 mg by mouth daily.      [name] Kitchen atenolol (TENORMIN) 25 MG tablet TAKE 1 TABLET BY MOUTH ONCE DAILY 30 tablet 1  . rosuvastatin (CRESTOR) 5 MG tablet TAKE ONE TABLET BY MOUTH DAILY FOR CHOLESTEROL 30 tablet 2  . valsartan (DIOVAN) 320 MG tablet Take 1 tablet by mouth daily. 30 tablet 2   No current facility-administered medications for this visit.     Allergies:   Penicillins   Social History:  The patient  reports that he has never smoked. He has never used smokeless tobacco. He reports  that he does not drink alcohol or use drugs.   Family History:  The patient's  family history includes Aneurysm (age of onset: 54) in his mother; Heart attack (age of onset: 67) in his father; Heart attack (age of onset: 75) in his brother.    ROS:  Please see the history of present illness.  All other systems are reviewed and negative.    PHYSICAL EXAM: VS:  BP 130/60   Pulse (!) 48   Ht 5' 11.75" (1.822 m)   Wt 186 lb 12.8 oz (84.7 kg)   BMI 25.51 kg/m  , BMI Body mass index is 25.51 kg/m. GEN: Well nourished, well developed, in no acute distress  HEENT: normal  Neck: no JVD, no masses. No carotid bruits Cardiac: bradycardic and regular without murmur or gallop                Respiratory:  clear to  auscultation bilaterally, normal work of breathing GI: soft, nontender, nondistended, + BS MS: no deformity or atrophy  Ext: no pretibial edema, pedal pulses 2+= bilaterally Skin: warm and dry, no rash Neuro:  Strength and sensation are intact Psych: euthymic mood, full affect  EKG:  EKG is ordered today. The ekg ordered today shows marked sinus bradycardia 45 bpm, first-degree AV block, otherwise normal  Recent Labs: 10/06/2016: ALT 16; BUN 31; Creat 0.92; Hemoglobin 15.0; Platelets 191; Potassium 4.5; Sodium 142   Lipid Panel     Component Value Date/Time   CHOL 126 10/06/2016 1016   TRIG 90 10/06/2016 1016   HDL 36 (L) 10/06/2016 1016   CHOLHDL 3.5 10/06/2016 1016   VLDL 18 10/06/2016 1016   LDLCALC 72 10/06/2016 1016      Wt Readings from Last 3 Encounters:  10/20/16 186 lb 12.8 oz (84.7 kg)  10/06/16 186 lb (84.4 kg)  08/13/15 186 lb 6.4 oz (84.6 kg)     Cardiac Studies Reviewed: Exercise Myoview stress test 10/06/2016: Study Highlights   Addendum by Thayer Headings, MD on Mon Oct 06, 2016 2:44 PM   Nuclear stress EF: 54%. The left ventricular ejection fraction is mildly decreased (45-54%).  There was no ST segment deviation noted during stress.  Defect 1: There is a large defect of moderate severity present in the basal inferoseptal, basal inferior, basal inferolateral, mid inferoseptal, mid inferior, mid inferolateral and apical inferior location.  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  This is a low risk study.  This study is unchanged from his previous study performed Oct. 1, 2015.       ASSESSMENT AND PLAN: 1.  CAD, native vessel, without symptoms of angina: The patient stress test is reviewed and it is low risk with no ischemia. His medical program is reviewed and he is on appropriate therapy. He has no symptoms related to coronary artery disease and will follow-up in one year with exercise treadmill testing per FAA  guidelines.  2. Essential hypertension: Blood pressure is well controlled on current therapy.  3. Hyperlipidemia: Lipids as outlined above. He will continue on Crestor 5 mg daily with dose limited by tolerability.  4. Sinus bradycardia: The patient is asymptomatic. He has no evidence of advanced conduction disease. No changes are made in his medical regimen. He has been on atenolol for many years.  Current medicines are reviewed with the patient today.  The patient does not have concerns regarding medicines.  Labs/ tests ordered today include:   Orders Placed This Encounter  Procedures  .  EKG 12-Lead   Disposition:   FU one year  Signed, Sherren Mocha, MD  10/20/2016 10:04 AM    [facility] [address], [place], [place]  [address] Phone: [phone]; Fax: [phone]

## 2016-10-28 ENCOUNTER — Other Ambulatory Visit: Payer: Self-pay | Admitting: Cardiovascular Disease

## 2016-11-03 HISTORY — PX: UMBILICAL HERNIA REPAIR: SHX196

## 2016-11-13 ENCOUNTER — Encounter: Payer: Self-pay | Admitting: Gastroenterology

## 2016-11-15 ENCOUNTER — Other Ambulatory Visit: Payer: Self-pay | Admitting: Cardiovascular Disease

## 2016-11-15 DIAGNOSIS — I2510 Atherosclerotic heart disease of native coronary artery without angina pectoris: Secondary | ICD-10-CM

## 2016-11-18 ENCOUNTER — Encounter: Payer: Self-pay | Admitting: Gastroenterology

## 2017-01-14 ENCOUNTER — Encounter: Payer: Medicare Other | Admitting: Gastroenterology

## 2017-02-17 ENCOUNTER — Ambulatory Visit (AMBULATORY_SURGERY_CENTER): Payer: Self-pay

## 2017-02-17 VITALS — Ht 72.0 in | Wt 190.2 lb

## 2017-02-17 DIAGNOSIS — Z86010 Personal history of colonic polyps: Secondary | ICD-10-CM

## 2017-02-17 MED ORDER — NA SULFATE-K SULFATE-MG SULF 17.5-3.13-1.6 GM/177ML PO SOLN: 1.0000 | Freq: Once | ORAL | 0 refills | Status: AC

## 2017-02-17 NOTE — Progress Notes (Signed)
Denies allergies to eggs or soy products. Denies complication of anesthesia or sedation. Denies use of weight loss medication. Denies use of O2.   Emmi instructions given for colonoscopy.  

## 2017-03-03 ENCOUNTER — Ambulatory Visit (AMBULATORY_SURGERY_CENTER): Payer: Medicare Other | Admitting: Gastroenterology

## 2017-03-03 ENCOUNTER — Encounter: Payer: Self-pay | Admitting: Gastroenterology

## 2017-03-03 VITALS — BP 110/83 | HR 49 | Temp 97.3°F | Resp 10 | Ht 72.0 in | Wt 190.0 lb

## 2017-03-03 DIAGNOSIS — Z86010 Personal history of colonic polyps: Secondary | ICD-10-CM | POA: Diagnosis not present

## 2017-03-03 MED ORDER — SODIUM CHLORIDE 0.9 % IV SOLN: 500.0000 mL | INTRAVENOUS | Status: DC

## 2017-03-03 NOTE — Progress Notes (Signed)
Pt's states no medical or surgical changes since previsit or office visit. 

## 2017-03-03 NOTE — Patient Instructions (Signed)
YOU HAD AN ENDOSCOPIC PROCEDURE TODAY AT THE [facility]:   Refer to the procedure report that was given to you for any specific questions about what was found during the examination.  If the procedure report does not answer your questions, please call your gastroenterologist to clarify.  If you requested that your care partner not be given the details of your procedure findings, then the procedure report has been included in a sealed envelope for you to review at your convenience later.  YOU SHOULD EXPECT: Some feelings of bloating in the abdomen. Passage of more gas than usual.  Walking can help get rid of the air that was put into your GI tract during the procedure and reduce the bloating. If you had a lower endoscopy (such as a colonoscopy or flexible sigmoidoscopy) you may notice spotting of blood in your stool or on the toilet paper. If you underwent a bowel prep for your procedure, you may not have a normal bowel movement for a few days.  Please Note:  You might notice some irritation and congestion in your nose or some drainage.  This is from the oxygen used during your procedure.  There is no need for concern and it should clear up in a day or so.  SYMPTOMS TO REPORT IMMEDIATELY:   Following lower endoscopy (colonoscopy or flexible sigmoidoscopy):  Excessive amounts of blood in the stool  Significant tenderness or worsening of abdominal pains  Swelling of the abdomen that is new, acute  Fever of 100F or higher    For urgent or emergent issues, a gastroenterologist can be reached at any hour by calling [phone].   DIET:  We do recommend a small meal at first, but then you may proceed to your regular diet.  Drink plenty of fluids but you should avoid alcoholic beverages for 24 hours.  ACTIVITY:  You should plan to take it easy for the rest of today and you should NOT DRIVE or use heavy machinery until tomorrow (because of the sedation medicines used during the test).     FOLLOW UP: Our staff will call the number listed on your records the next business day following your procedure to check on you and address any questions or concerns that you may have regarding the information given to you following your procedure. If we do not reach you, we will leave a message.  However, if you are feeling well and you are not experiencing any problems, there is no need to return our call.  We will assume that you have returned to your regular daily activities without incident.  If any biopsies were taken you will be contacted by phone or by letter within the next 1-3 weeks.  Please call us at [phone] if you have not heard about the biopsies in 3 weeks.    SIGNATURES/CONFIDENTIALITY: You and/or your care partner have signed paperwork which will be entered into your electronic medical record.  These signatures attest to the fact that that the information above on your After Visit Summary has been reviewed and is understood.  Full responsibility of the confidentiality of this discharge information lies with you and/or your care-partner.  Resume medications.Information given on diverticulosis and hemorrhoids. 

## 2017-03-03 NOTE — Progress Notes (Signed)
Report to PACU, RN, vss, BBS= Clear.  

## 2017-03-03 NOTE — Op Note (Signed)
[place] Patient Name: Russell [name] Procedure Date: 03/03/2017 11:05 AM MRN: [id number] Endoscopist: Ladene Artist , MD Age: 76 Referring MD:  Date of Birth: 1941/07/22 Gender: Male Account #: [id number] Procedure:                Colonoscopy Indications:              Surveillance: Personal history of adenomatous                            polyps on last colonoscopy 5 years ago Medicines:                Monitored Anesthesia Care Procedure:                Pre-Anesthesia Assessment:                           - Prior to the procedure, a History and Physical                            was performed, and patient medications and                            allergies were reviewed. The patient's tolerance of                            previous anesthesia was also reviewed. The risks                            and benefits of the procedure and the sedation                            options and risks were discussed with the patient.                            All questions were answered, and informed consent                            was obtained. Prior Anticoagulants: The patient has                            taken no previous anticoagulant or antiplatelet                            agents. ASA Grade Assessment: II - A patient with                            mild systemic disease. After reviewing the risks                            and benefits, the patient was deemed in                            satisfactory condition to undergo the procedure.  After obtaining informed consent, the colonoscope                            was passed under direct vision. Throughout the                            procedure, the patient's blood pressure, pulse, and                            oxygen saturations were monitored continuously. The                            Colonoscope was introduced through the anus and                            advanced to the the cecum,  identified by                            appendiceal orifice and ileocecal valve. The                            ileocecal valve, appendiceal orifice, and rectum                            were photographed. The quality of the bowel                            preparation was good. The colonoscopy was performed                            without difficulty. The patient tolerated the                            procedure well. Scope In: 11:13:58 AM Scope Out: 11:27:49 AM Scope Withdrawal Time: 0 hours 11 minutes 2 seconds  Total Procedure Duration: 0 hours 13 minutes 51 seconds  Findings:                 The perianal and digital rectal examinations were                            normal.                           A few small-mouthed diverticula were found in the                            sigmoid colon.                           Internal hemorrhoids were found during                            retroflexion. The hemorrhoids were small and Grade  I (internal hemorrhoids that do not prolapse).                           The exam was otherwise without abnormality on                            direct and retroflexion views. Complications:            No immediate complications. Estimated blood loss:                            None. Estimated Blood Loss:     Estimated blood loss: none. Impression:               - Diverticulosis in the sigmoid colon.                           - Internal hemorrhoids.                           - The examination was otherwise normal on direct                            and retroflexion views.                           - No specimens collected. Recommendation:           - Patient has a contact number available for                            emergencies. The signs and symptoms of potential                            delayed complications were discussed with the                            patient. Return to normal activities tomorrow.                             Written discharge instructions were provided to the                            patient.                           - Resume previous diet.                           - Continue present medications.                           - No repeat colonoscopy due to age. Ladene Artist, MD 03/03/2017 11:30:41 AM This report has been signed electronically.

## 2017-03-04 ENCOUNTER — Telehealth: Payer: Self-pay | Admitting: *Deleted

## 2017-03-04 NOTE — Telephone Encounter (Signed)
  Follow up Call-  Call back number 03/03/2017  Post procedure Call Back phone  # [phone]  Permission to leave phone message Yes  Some recent data might be hidden     Patient questions:  Do you have a fever, pain , or abdominal swelling? No. Pain Score  0 *  Have you tolerated food without any problems? Yes.    Have you been able to return to your normal activities? Yes.    Do you have any questions about your discharge instructions: Diet   No. Medications  No. Follow up visit  No.  Do you have questions or concerns about your Care? No.  Actions: * If pain score is 4 or above: No action needed, pain <4.

## 2017-03-06 ENCOUNTER — Encounter: Payer: Self-pay | Admitting: Family Medicine

## 2017-04-13 ENCOUNTER — Encounter: Payer: Self-pay | Admitting: Family Medicine

## 2017-05-09 ENCOUNTER — Encounter: Payer: Self-pay | Admitting: Cardiovascular Disease

## 2017-05-26 ENCOUNTER — Telehealth: Payer: Self-pay | Admitting: Pharmacist

## 2017-05-26 MED ORDER — IRBESARTAN 300 MG PO TABS: 300.0000 mg | ORAL_TABLET | Freq: Every day | ORAL | 1 refills | Status: DC

## 2017-05-26 NOTE — Telephone Encounter (Signed)
Received fax from pharmacy requesting change for valsartan. Spoke with patient and answered questions about recall and made aware of change. Advised to monitor pressures several times over next 3-4 weeks and call with any issues. Pt states understanding and appreciation.

## 2017-05-29 ENCOUNTER — Other Ambulatory Visit: Payer: Self-pay

## 2017-05-29 DIAGNOSIS — I25810 Atherosclerosis of coronary artery bypass graft(s) without angina pectoris: Secondary | ICD-10-CM

## 2017-05-29 DIAGNOSIS — I10 Essential (primary) hypertension: Secondary | ICD-10-CM

## 2017-05-29 DIAGNOSIS — E782 Mixed hyperlipidemia: Secondary | ICD-10-CM

## 2017-06-16 ENCOUNTER — Other Ambulatory Visit: Payer: Self-pay | Admitting: Cardiovascular Disease

## 2017-06-16 MED ORDER — ROSUVASTATIN CALCIUM 5 MG PO TABS: ORAL_TABLET | ORAL | 0 refills | Status: DC

## 2017-09-12 ENCOUNTER — Other Ambulatory Visit: Payer: Self-pay | Admitting: Cardiovascular Disease

## 2017-09-12 DIAGNOSIS — I2510 Atherosclerotic heart disease of native coronary artery without angina pectoris: Secondary | ICD-10-CM

## 2017-09-14 ENCOUNTER — Encounter: Payer: Self-pay | Admitting: Family Medicine

## 2017-09-14 NOTE — Telephone Encounter (Signed)
Medication Detail    Disp Refills Start End   amLODipine (NORVASC) 10 MG tablet 90 tablet 3 11/17/2016    Sig - Route: Take 1 tablet (10 mg total) by mouth daily. - Oral   Sent to pharmacy as: amLODipine (NORVASC) 10 MG tablet   E-Prescribing Status: Receipt confirmed by pharmacy (11/17/2016 2:55 PM EST)   Associated Diagnoses   Coronary artery disease involving native coronary artery of native heart without angina pectoris     [place], 

## 2017-09-15 ENCOUNTER — Other Ambulatory Visit: Payer: Medicare Other

## 2017-09-15 ENCOUNTER — Ambulatory Visit (INDEPENDENT_AMBULATORY_CARE_PROVIDER_SITE_OTHER): Payer: Medicare Other

## 2017-09-15 DIAGNOSIS — I10 Essential (primary) hypertension: Secondary | ICD-10-CM

## 2017-09-15 DIAGNOSIS — I25810 Atherosclerosis of coronary artery bypass graft(s) without angina pectoris: Secondary | ICD-10-CM | POA: Diagnosis not present

## 2017-09-15 DIAGNOSIS — E782 Mixed hyperlipidemia: Secondary | ICD-10-CM | POA: Diagnosis not present

## 2017-09-15 LAB — LIPID PANEL
Chol/HDL Ratio: 3.1 ratio (ref 0.0–5.0)
Cholesterol, Total: 105 mg/dL (ref 100–199)
HDL: 34 mg/dL — AB (ref >39)
LDL Calculated: 52 mg/dL (ref 0–99)
Triglycerides: 94 mg/dL (ref 0–149)
VLDL CHOLESTEROL CAL: 19 mg/dL (ref 5–40)

## 2017-09-15 LAB — EXERCISE TOLERANCE TEST
CSEPED: 9 min
CSEPEW: 10.1 METS
CSEPPHR: 162 {beats}/min
Exercise duration (sec): 0 s
MPHR: 145 {beats}/min
Percent HR: 111 %
RPE: 14
Rest HR: 63 {beats}/min

## 2017-09-15 LAB — COMPREHENSIVE METABOLIC PANEL
ALT: 32 IU/L (ref 0–44)
AST: 28 IU/L (ref 0–40)
Albumin/Globulin Ratio: 2.4 — ABNORMAL HIGH (ref 1.2–2.2)
Albumin: 4.7 g/dL (ref 3.5–4.8)
Alkaline Phosphatase: 76 IU/L (ref 39–117)
BILIRUBIN TOTAL: 0.6 mg/dL (ref 0.0–1.2)
BUN/Creatinine Ratio: 17 (ref 10–24)
BUN: 20 mg/dL (ref 8–27)
CALCIUM: 9.3 mg/dL (ref 8.6–10.2)
CHLORIDE: 103 mmol/L (ref 96–106)
CO2: 24 mmol/L (ref 20–29)
Creatinine, Ser: 1.15 mg/dL (ref 0.76–1.27)
GFR, EST AFRICAN AMERICAN: 72 mL/min/{1.73_m2} (ref >59)
GFR, EST NON AFRICAN AMERICAN: 62 mL/min/{1.73_m2} (ref >59)
GLUCOSE: 106 mg/dL — AB (ref 65–99)
Globulin, Total: 2 g/dL (ref 1.5–4.5)
Potassium: 4.2 mmol/L (ref 3.5–5.2)
Sodium: 144 mmol/L (ref 134–144)
TOTAL PROTEIN: 6.7 g/dL (ref 6.0–8.5)

## 2017-09-15 LAB — CBC
Hematocrit: 43.2 % (ref 37.5–51.0)
Hemoglobin: 15 g/dL (ref 13.0–17.7)
MCH: 30.6 pg (ref 26.6–33.0)
MCHC: 34.7 g/dL (ref 31.5–35.7)
MCV: 88 fL (ref 79–97)
PLATELETS: 203 10*3/uL (ref 150–379)
RBC: 4.9 x10E6/uL (ref 4.14–5.80)
RDW: 13.7 % (ref 12.3–15.4)
WBC: 6.9 10*3/uL (ref 3.4–10.8)

## 2017-09-17 DIAGNOSIS — I10 Essential (primary) hypertension: Secondary | ICD-10-CM | POA: Insufficient documentation

## 2017-09-17 DIAGNOSIS — R972 Elevated prostate specific antigen [PSA]: Secondary | ICD-10-CM | POA: Insufficient documentation

## 2017-09-17 DIAGNOSIS — H43813 Vitreous degeneration, bilateral: Secondary | ICD-10-CM | POA: Insufficient documentation

## 2017-09-17 DIAGNOSIS — N182 Chronic kidney disease, stage 2 (mild): Secondary | ICD-10-CM | POA: Insufficient documentation

## 2017-09-17 DIAGNOSIS — E785 Hyperlipidemia, unspecified: Secondary | ICD-10-CM | POA: Insufficient documentation

## 2017-09-30 ENCOUNTER — Encounter: Payer: Self-pay | Admitting: Cardiovascular Disease

## 2017-09-30 ENCOUNTER — Ambulatory Visit (INDEPENDENT_AMBULATORY_CARE_PROVIDER_SITE_OTHER): Payer: Medicare Other | Admitting: Cardiovascular Disease

## 2017-09-30 VITALS — BP 122/58 | HR 68 | Ht 72.0 in | Wt 187.6 lb

## 2017-09-30 DIAGNOSIS — E782 Mixed hyperlipidemia: Secondary | ICD-10-CM | POA: Diagnosis not present

## 2017-09-30 DIAGNOSIS — R001 Bradycardia, unspecified: Secondary | ICD-10-CM

## 2017-09-30 DIAGNOSIS — I2510 Atherosclerotic heart disease of native coronary artery without angina pectoris: Secondary | ICD-10-CM

## 2017-09-30 DIAGNOSIS — I10 Essential (primary) hypertension: Secondary | ICD-10-CM

## 2017-09-30 NOTE — Progress Notes (Signed)
Cardiology Office Note Date:  09/30/2017   ID:  Draven, Natter Nov 30, 1940, MRN [id number]  PCP:  Tammi Sou, MD  Cardiologist:  Sherren Mocha, MD    Chief Complaint  Patient presents with  . Coronary Artery Disease     History of Present Illness: Russell [name] is a 76 y.o. male who presents for follow-up of CAD.   The patient underwent multivessel CABG in 2003.  He was treated with a LIMA to LAD, saphenous vein graft to diagonal, saphenous vein graft to obtuse marginal, and sequential vein graft to second and third OM branches.  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  He has had alternating exercise treadmill studies and nuclear stress testing every other year.  The patient is here alone today.  He feels well.  He is not engaged in routine daily exercise, but remains active.  He denies chest pain, chest pressure, shortness of breath, edema, or heart palpitations.  He is compliant with his medications.  Notes that he is taking all of his cardiac medicines at night.  He does not check his blood pressure at home frequently, but feels that his average blood pressure is approximately 135/75.  The patient weaned off of atenolol because of bradycardia and generalized sluggishness.  He feels like his energy level is a little better since he stopped this.   Past Medical History:  Diagnosis Date  . Adenomatous colon polyp 2005  . Blood transfusion 2003  . BPH (benign prostatic hypertrophy)   . CAD (coronary artery disease) 2003  . Chronic renal insufficiency, stage II (mild)    CrCl 60s  . Elevated PSA    Prostate bx x 2 (BPH only).  As of 08/2017, PSA continues to rise and Dr. 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  [name] Kitchen Herpes zoster 12/2014   R side of chin (V3 area)  . Hyperlipidemia   . Hypertension   . Ulcer 1981  . Vitreous detachment of both  eyes    + cataract    Past Surgical History:  Procedure Laterality Date  . BASAL CELL CARCINOMA EXCISION  04/2011   Mohs , Dr Harvel Quale  . BIOPSY PROSTATE  approx 2010   Dr. Amalia Hailey.  [name] Kitchen CARDIOVASCULAR STRESS TEST  08/2014; 08/2015   2015 Low risk: no ischemia.  Inferior/inferolateral scar and slight decreased mobility of LV wall in this region, EF normal.  2016 ETT low risk.  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  [name] Kitchen COLONOSCOPY     12/2011 'tics, int hem, o/w NORMAL.  Repeat 02/2017 showed no polyps, +internal hem and sigmoid diverticulosis.. (negative 2008, polyp 2005; Dr Fuller Plan). No repeat recommended due to age.  . CORONARY ARTERY BYPASS GRAFT  2003   5 vessel (Dr. Cyndia Bent)  . [place]  . ganglion cystectomy      hand  . HEMORRHOID SURGERY     Dr. Harlow Asa  . INGUINAL HERNIA REPAIR Left   . LUMBAR LAMINECTOMY     Dr. Quincy Carnes  . POLYPECTOMY      Current Outpatient Medications  Medication Sig Dispense Refill  . amLODipine (NORVASC) 10 MG tablet Take 1 tablet (10 mg total) by mouth daily. 90 tablet 3  . aspirin 81 MG tablet Take 81 mg by mouth daily.      [name] Kitchen  atenolol (TENORMIN) 25 MG tablet TAKE 1 TABLET BY MOUTH ONCE DAILY 90 tablet 3  . irbesartan (AVAPRO) 300 MG tablet Take 1 tablet (300 mg total) by mouth daily. 90 tablet 1  . OVER THE COUNTER MEDICATION 1 tablet daily.    . rosuvastatin (CRESTOR) 5 MG tablet TAKE ONE TABLET BY MOUTH DAILY FOR CHOLESTEROL 90 tablet 0   Current Facility-Administered Medications  Medication Dose Route Frequency Provider Last Rate Last Dose  . 0.9 %  sodium chloride infusion  500 mL Intravenous Continuous Ladene Artist, MD        Allergies:   Penicillins   Social History:  The patient  reports that  has never smoked. he has never used smokeless tobacco. He reports that he does not drink alcohol or use drugs.   Family History:  The patient's family  history includes Aneurysm (age of onset: 17) in his mother; Heart attack (age of onset: 49) in his father; Heart attack (age of onset: 73) in his brother.    ROS:  Please see the history of present illness.  All other systems are reviewed and negative.    PHYSICAL EXAM: VS:  BP (!) 122/58   Pulse 68   Ht 6' (1.829 m)   Wt 187 lb 9.6 oz (85.1 kg)   BMI 25.44 kg/m  , BMI Body mass index is 25.44 kg/m. GEN: Well nourished, well developed, in no acute distress  HEENT: normal  Neck: no JVD, no masses. No carotid bruits Cardiac: RRR without murmur or gallop      Respiratory:  clear to auscultation bilaterally, normal work of breathing GI: soft, nontender, nondistended, + BS MS: no deformity or atrophy  Ext: no pretibial edema, pedal pulses 2+= bilaterally Skin: warm and dry, no rash Neuro:  Strength and sensation are intact Psych: euthymic mood, full affect  EKG:  EKG is not ordered today.  Recent Labs: 09/15/2017: ALT 32; BUN 20; Creatinine, Ser 1.15; Hemoglobin 15.0; Platelets 203; Potassium 4.2; Sodium 144   Lipid Panel     Component Value Date/Time   CHOL 105 09/15/2017 0908   TRIG 94 09/15/2017 0908   HDL 34 (L) 09/15/2017 0908   CHOLHDL 3.1 09/15/2017 0908   CHOLHDL 3.5 10/06/2016 1016   VLDL 18 10/06/2016 1016   LDLCALC 52 09/15/2017 0908      Wt Readings from Last 3 Encounters:  09/30/17 187 lb 9.6 oz (85.1 kg)  03/03/17 190 lb (86.2 kg)  02/17/17 190 lb 3.2 oz (86.3 kg)     Cardiac Studies Reviewed: GXT 09-15-2017: Study Highlights    Blood pressure demonstrated a hypertensive response to exercise.  Upsloping ST segment depression ST segment depression was noted during stress in the V6, V5, V4, II and III leads.  Negative, adequate stress test.   Stress Measurements   Baseline Vitals  Rest HR 63 bpm    Rest BP 141/75 mmHg    Exercise Time  Exercise duration (min) 9 min    Exercise duration (sec) 0 sec    Peak Stress Vitals  Peak HR 162 bpm      Peak BP 215/73 mmHg    Exercise Data  MPHR 145 bpm    Percent HR 111 %    RPE 14     Estimated workload 10.1 METS         ASSESSMENT AND PLAN: 1.  Coronary artery disease, native vessel, without symptoms of angina: The patient remains asymptomatic on his current medical regimen which includes  aspirin and a statin drug.  2.  Hypertension: Blood pressure is well controlled.  Atenolol is discontinued by the patient because of bradycardia and fatigue.  Symptoms are improved off of this medication.  He will continue on a combination of amlodipine and irbesartan.  3.  Hyperlipidemia: Remains on Crestor 5 mg daily with dose limited by statin intolerance.  Recent labs reviewed with a cholesterol of 105, HDL 34, LDL 52, and triglycerides 94.  Liver function tests are within normal limits.  4.  Sinus bradycardia: Resolved with discontinuation of atenolol.  Current medicines are reviewed with the patient today.  The patient does not have concerns regarding medicines.  Labs/ tests ordered today include:   Orders Placed This Encounter  Procedures  . MYOCARDIAL PERFUSION IMAGING    Disposition:   FU one year with exercise nuclear scan prior to his visit.   Signed, Sherren Mocha, MD  09/30/2017 5:15 PM    [facility] [address], [place], [place]  [address] Phone: [phone]; Fax: [phone]

## 2017-09-30 NOTE — Patient Instructions (Signed)
Medication Instructions:  Your provider recommends that you continue on your current medications as directed. Please refer to the Current Medication list given to you today.    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  Follow-Up: Your provider wants you to follow-up in: 1 year with Dr. Burt Knack. You will receive a reminder letter in the mail two months in advance. If you don't receive a letter, please call our office to schedule the follow-up appointment.    Any Other Special Instructions Will Be Listed Below (If Applicable).     If you need a refill on your cardiac medications before your next appointment, please call your pharmacy.

## 2017-10-03 ENCOUNTER — Encounter: Payer: Self-pay | Admitting: Family Medicine

## 2017-10-19 ENCOUNTER — Other Ambulatory Visit: Payer: Self-pay | Admitting: Cardiovascular Disease

## 2017-11-03 HISTORY — PX: INGUINAL HERNIA REPAIR: SHX194

## 2017-11-21 ENCOUNTER — Other Ambulatory Visit: Payer: Self-pay | Admitting: Cardiovascular Disease

## 2017-11-25 ENCOUNTER — Encounter: Payer: Self-pay | Admitting: Family Medicine

## 2017-12-05 ENCOUNTER — Other Ambulatory Visit: Payer: Self-pay | Admitting: Cardiovascular Disease

## 2017-12-05 ENCOUNTER — Other Ambulatory Visit: Payer: Self-pay | Admitting: Surgery

## 2017-12-05 DIAGNOSIS — I2510 Atherosclerotic heart disease of native coronary artery without angina pectoris: Secondary | ICD-10-CM

## 2017-12-22 ENCOUNTER — Encounter: Payer: Self-pay | Admitting: Family Medicine

## 2018-01-02 ENCOUNTER — Encounter: Payer: Self-pay | Admitting: Family Medicine

## 2018-01-14 ENCOUNTER — Telehealth: Payer: Medicare Other | Admitting: Family

## 2018-01-14 DIAGNOSIS — R6889 Other general symptoms and signs: Secondary | ICD-10-CM

## 2018-01-14 MED ORDER — OSELTAMIVIR PHOSPHATE 75 MG PO CAPS: 75.0000 mg | ORAL_CAPSULE | Freq: Two times a day (BID) | ORAL | 0 refills | Status: DC

## 2018-01-14 NOTE — Progress Notes (Signed)
Thank you for the details you included in the comment boxes. Those details are very helpful in determining the best course of treatment for you and help [place] to provide the best care. The phone call discussion was very helpful. It appears you may have possibly been exposed to the flu. As we discussed, as you are not having significant respiratory involvement at this point, hold off on taking the Tamiflu and see if respiratory symptoms arise as they have in your wife. If they do (coughing, congestion, shortness of breath, etc.), begin to take the Tamiflu. Otherwise, as we discussed, this may be a GI issue that is resolving (short-term virus) as your dry heaving has improved.  E visit for Flu like symptoms   We are sorry that you are not feeling well.  Here is how we plan to help! Based on what you have shared with me it looks like you may have possible exposure to a virus that causes influenza.  Influenza or "the flu" is   an infection caused by a respiratory virus. The flu virus is highly contagious and persons who did not receive their yearly flu vaccination may "catch" the flu from close contact.  We have anti-viral medications to treat the viruses that cause this infection. They are not a "cure" and only shorten the course of the infection. These prescriptions are most effective when they are given within the first 2 days of "flu" symptoms. Antiviral medication are indicated if you have a high risk of complications from the flu. You should  also consider an antiviral medication if you are in close contact with someone who is at risk. These medications can help patients avoid complications from the flu  but have side effects that you should know. Possible side effects from Tamiflu or oseltamivir include nausea, vomiting, diarrhea, dizziness, headaches, eye redness, sleep problems or other respiratory symptoms. You should not take Tamiflu if you have an allergy to oseltamivir or any to the ingredients in  Tamiflu.  Based upon your symptoms and potential risk factors I have prescribed Oseltamivir (Tamiflu).  It has been sent to your designated pharmacy.  You will take one 75 mg capsule orally twice a day for the next 5 days.  ANYONE WHO HAS FLU SYMPTOMS SHOULD: . Stay home. The flu is highly contagious and going out or to work exposes others! . Be sure to drink plenty of fluids. Water is fine as well as fruit juices, sodas and electrolyte beverages. You may want to stay away from caffeine or alcohol. If you are nauseated, try taking small sips of liquids. How do you know if you are getting enough fluid? Your urine should be a pale yellow or almost colorless. . Get rest. . Taking a steamy shower or using a humidifier may help nasal congestion and ease sore throat pain. Using a saline nasal spray works much the same way. . Cough drops, hard candies and sore throat lozenges may ease your cough. . Line up a caregiver. Have someone check on you regularly.   GET HELP RIGHT AWAY IF: . You cannot keep down liquids or your medications. . You become short of breath . Your fell like you are going to pass out or loose consciousness. . Your symptoms persist after you have completed your treatment plan MAKE SURE YOU   Understand these instructions.  Will watch your condition.  Will get help right away if you are not doing well or get worse.  Your e-visit answers were reviewed  by a board certified advanced clinical practitioner to complete your personal care plan.  Depending on the condition, your plan could have included both over the counter or prescription medications.  If there is a problem please reply  once you have received a response from your provider.  Your safety is important to [place].  If you have drug allergies check your prescription carefully.    You can use MyChart to ask questions about today's visit, request a non-urgent call back, or ask for a work or school excuse for 24 hours related  to this e-Visit. If it has been greater than 24 hours you will need to follow up with your provider, or enter a new e-Visit to address those concerns.  You will get an e-mail in the next two days asking about your experience.  I hope that your e-visit has been valuable and will speed your recovery. Thank you for using e-visits.

## 2018-02-01 ENCOUNTER — Ambulatory Visit (INDEPENDENT_AMBULATORY_CARE_PROVIDER_SITE_OTHER): Payer: Medicare Other | Admitting: Family Medicine

## 2018-02-01 ENCOUNTER — Encounter: Payer: Self-pay | Admitting: Family Medicine

## 2018-02-01 VITALS — BP 112/68 | HR 104 | Temp 98.4°F | Resp 16 | Ht 72.0 in | Wt 178.4 lb

## 2018-02-01 DIAGNOSIS — I2510 Atherosclerotic heart disease of native coronary artery without angina pectoris: Secondary | ICD-10-CM

## 2018-02-01 DIAGNOSIS — J0100 Acute maxillary sinusitis, unspecified: Secondary | ICD-10-CM | POA: Diagnosis not present

## 2018-02-01 DIAGNOSIS — M109 Gout, unspecified: Secondary | ICD-10-CM | POA: Diagnosis not present

## 2018-02-01 MED ORDER — INDOMETHACIN 50 MG PO CAPS: ORAL_CAPSULE | ORAL | 1 refills | Status: DC

## 2018-02-01 MED ORDER — CLINDAMYCIN HCL 300 MG PO CAPS: 300.0000 mg | ORAL_CAPSULE | Freq: Three times a day (TID) | ORAL | 0 refills | Status: DC

## 2018-02-01 NOTE — Progress Notes (Signed)
OFFICE VISIT  02/08/2018   CC:  Chief Complaint  Patient presents with  . Sinus Infection    ?  [name] Kitchen Toe Pain    Gout? R great toe    HPI:    Patient is a 77 y.o.  male who presents for "sinus infection". About 1 week of facial pressure. Waxing/waning URI sx's for last 2 wks.  No nasal congestion/runny nose recently.  Slight tickle cough but not much. Has felt lousy x 3 wks, Tm 101 about 7-10d ago. Decreased appetite.  Signif periorbital and frontal headache.  No n/v/d.  Is currently doing self cath due to BPH with signif obstruction: has post-op urinary retention after hernia surgery recently. Denies cloudy urine, no dysuria.  Past Medical History:  Diagnosis Date  . Adenomatous colon polyp 2005  . Blood transfusion 2003  . BPH (benign prostatic hypertrophy)    Hx of post-op acute urinary retention 2019.  [name] Kitchen CAD (coronary artery disease) 2003   CABG 2003  . Chronic renal insufficiency, stage II (mild)    CrCl 60s  . Elevated PSA    Prostate bx x 2 (BPH only).  As of 08/2017, PSA continues to rise +abnl prostate MRI, and Dr. 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  [name] Kitchen Herpes zoster 12/2014   R side of chin (V3 area)  . Hyperlipidemia   . Hypertension   . Right inguinal hernia    Repaired 2019  . Ulcer 1981  . Vitreous detachment of both eyes    + cataract    Past Surgical History:  Procedure Laterality Date  . BASAL CELL CARCINOMA EXCISION  04/2011   Mohs , Dr Harvel Quale  . BIOPSY PROSTATE  approx 2010   Dr. Amalia Hailey.  [name] Kitchen CARDIOVASCULAR STRESS TEST  08/2014; 08/2015   2015 Low risk: no ischemia.  Inferior/inferolateral scar and slight decreased mobility of LV wall in this region, EF normal.  2016 ETT low risk.  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  [name] Kitchen COLONOSCOPY     12/2011 'tics, int hem, o/w NORMAL.  Repeat 02/2017 showed no polyps,  +internal hem and sigmoid diverticulosis.. (negative 2008, polyp 2005; Dr Fuller Plan). No repeat recommended due to age.  . CORONARY ARTERY BYPASS GRAFT  2003   5 vessel (Dr. Cyndia Bent)  . [place]  . ganglion cystectomy      hand  . HEMORRHOID SURGERY     Dr. Harlow Asa  . INGUINAL HERNIA REPAIR     Bilat  . LUMBAR LAMINECTOMY     Dr. Quincy Carnes  . POLYPECTOMY    . UMBILICAL HERNIA REPAIR  2018    Outpatient Medications Prior to Visit  Medication Sig Dispense Refill  . amLODipine (NORVASC) 10 MG tablet Take 1 tablet (10 mg total) by mouth daily. 90 tablet 2  . aspirin 81 MG tablet Take 81 mg by mouth daily.      . irbesartan (AVAPRO) 300 MG tablet Take 1 tablet (300 mg total) by mouth daily. 30 tablet 11  . OVER THE COUNTER MEDICATION 1 tablet daily.    . rosuvastatin (CRESTOR) 5 MG tablet TAKE ONE TABLET BY MOUTH DAILY FOR CHOLESTEROL 90 tablet 2  . atenolol (TENORMIN) 25 MG tablet TAKE 1 TABLET BY MOUTH ONCE DAILY (Patient not taking: Reported on 02/01/2018) 90 tablet 3  . oseltamivir (TAMIFLU) 75  MG capsule Take 1 capsule (75 mg total) by mouth 2 (two) times daily. (Patient not taking: Reported on 02/01/2018) 10 capsule 0  . 0.9 %  sodium chloride infusion      No facility-administered medications prior to visit.     Allergies  Allergen Reactions  . Penicillins     hives    ROS As per HPI  PE: Blood pressure 112/68, pulse (!) 104, temperature 98.4 F (36.9 C), temperature source Oral, resp. rate 16, height 6' (1.829 m), weight 178 lb 6 oz (80.9 kg), SpO2 98 %. VS: noted--normal. Gen: alert, NAD, NONTOXIC APPEARING. HEENT: eyes without injection, drainage, or swelling.  Ears: EACs clear, TMs with normal light reflex and landmarks.  Nose: Clear rhinorrhea, with some dried, crusty exudate adherent to mildly injected mucosa.  No purulent d/c.  Mild TTP in frontal and ethmoid paranasal sinus TTP.  No facial swelling.  Throat and mouth without focal lesion.  No  pharyngial swelling, erythema, or exudate.   Neck: supple, no LAD.   LUNGS: CTA bilat, nonlabored resps.   CV: RRR, no m/r/g. EXT: no c/c/e SKIN: no rash  R great toe with mild warmth and TTP/mild pinkish hue to 1st MTP  LABS:  none  IMPRESSION AND PLAN:  1) Acute sinusitis: penicillin allergy. Clinamycin 300 mg tid x 10d.  2) Gout, R MTP joint: starting to resolve. Start indocin 50 tid prn.  An After Visit Summary was printed and given to the patient.  FOLLOW UP: Return if symptoms worsen or fail to improve.  Signed:  Crissie Sickles, MD           02/08/2018

## 2018-02-02 ENCOUNTER — Telehealth: Payer: Self-pay

## 2018-02-02 ENCOUNTER — Other Ambulatory Visit: Payer: Self-pay

## 2018-02-02 MED ORDER — COLCHICINE 0.6 MG PO TABS: 0.6000 mg | ORAL_TABLET | Freq: Every day | ORAL | 1 refills | Status: DC

## 2018-02-02 MED ORDER — COLCHICINE 0.6 MG PO TABS: ORAL_TABLET | ORAL | 1 refills | Status: DC

## 2018-02-02 NOTE — Telephone Encounter (Signed)
Fax received requesting PA, PA submitted for Indomethacin and denied, PA submitted for Colchicine and was denied. Alternative medication Colcrys was suggested by [employer]. Dr. Anitra Lauth notified and approved medication. Medicine was prescribed and sent to pharmacy.

## 2018-02-02 NOTE — Telephone Encounter (Signed)
Rx sent to pharmacy   

## 2018-02-02 NOTE — Telephone Encounter (Signed)
Agree. Dose and Sig dictated to [place] in order to send rx today.

## 2018-02-08 ENCOUNTER — Encounter: Payer: Self-pay | Admitting: Family Medicine

## 2018-02-08 ENCOUNTER — Ambulatory Visit (INDEPENDENT_AMBULATORY_CARE_PROVIDER_SITE_OTHER): Payer: Medicare Other | Admitting: Family Medicine

## 2018-02-08 VITALS — BP 112/62 | HR 97 | Temp 97.9°F | Resp 16 | Ht 72.0 in | Wt 179.2 lb

## 2018-02-08 DIAGNOSIS — G933 Postviral fatigue syndrome: Secondary | ICD-10-CM

## 2018-02-08 DIAGNOSIS — G9331 Postviral fatigue syndrome: Secondary | ICD-10-CM

## 2018-02-08 DIAGNOSIS — I2510 Atherosclerotic heart disease of native coronary artery without angina pectoris: Secondary | ICD-10-CM

## 2018-02-08 NOTE — Progress Notes (Signed)
OFFICE VISIT  02/08/2018   CC:  Chief Complaint  Patient presents with  . Follow-up    Sinus Infection   HPI:    Patient is a 77 y.o.  male who presents for 1 week f/u sinusitis and R MTP gout.   For insurer reasons we had to change the indocin (he had picked up this rx before we ended up sending in [place] in place of indocin. Has some frontal HA and soreness in back of neck, no nasal congestion.  Feels a bit tired.  Appetite is down still. No fevers.  Just starting to feel a little better today, though.  Of note, toe is 95% better.   Past Medical History:  Diagnosis Date  . Adenomatous colon polyp 2005  . Blood transfusion 2003  . BPH (benign prostatic hypertrophy)    Hx of post-op acute urinary retention 2019.  [name] Kitchen CAD (coronary artery disease) 2003   CABG 2003  . Chronic renal insufficiency, stage II (mild)    CrCl 60s  . Elevated PSA    Prostate bx x 2 (BPH only).  As of 08/2017, PSA continues to rise +abnl prostate MRI, and Dr. 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  . Gout summer 2018   left elbow  . Herpes zoster 12/2014   R side of chin (V3 area)  . Hyperlipidemia   . Hypertension   . Right inguinal hernia    Repaired 2019  . Ulcer 1981  . Vitreous detachment of both eyes    + cataract    Past Surgical History:  Procedure Laterality Date  . BASAL CELL CARCINOMA EXCISION  04/2011   Mohs , Dr Harvel Quale  . BIOPSY PROSTATE  approx 2010   Dr. Amalia Hailey.  [name] Kitchen CARDIOVASCULAR STRESS TEST  08/2014; 08/2015   2015 Low risk: no ischemia.  Inferior/inferolateral scar and slight decreased mobility of LV wall in this region, EF normal.  2016 ETT low risk.  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  [name] Kitchen COLONOSCOPY     12/2011 'tics, int hem, o/w NORMAL.  Repeat 02/2017 showed no polyps, +internal hem and sigmoid diverticulosis.. (negative 2008,  polyp 2005; Dr Fuller Plan). No repeat recommended due to age.  . CORONARY ARTERY BYPASS GRAFT  2003   5 vessel (Dr. Cyndia Bent)  . [place]  . ganglion cystectomy      hand  . HEMORRHOID SURGERY     Dr. Harlow Asa  . INGUINAL HERNIA REPAIR     Bilat  . LUMBAR LAMINECTOMY     Dr. Quincy Carnes  . POLYPECTOMY    . UMBILICAL HERNIA REPAIR  2018    Outpatient Medications Prior to Visit  Medication Sig Dispense Refill  . amLODipine (NORVASC) 10 MG tablet Take 1 tablet (10 mg total) by mouth daily. 90 tablet 2  . aspirin 81 MG tablet Take 81 mg by mouth daily.      . clindamycin (CLEOCIN) 300 MG capsule Take 1 capsule (300 mg total) by mouth 3 (three) times daily. 42 capsule 0  . indomethacin (INDOCIN) 50 MG capsule 1 tab po tid as needed for gout pain 30 capsule 1  . irbesartan (AVAPRO) 300 MG tablet Take 1 tablet (300 mg total) by mouth daily. 30 tablet 11  . OVER THE COUNTER MEDICATION 1 tablet daily.    . rosuvastatin (CRESTOR)  5 MG tablet TAKE ONE TABLET BY MOUTH DAILY FOR CHOLESTEROL 90 tablet 2  . colchicine (COLCRYS) 0.6 MG tablet Take 1 tablet by mouth at onset of gout pain, then 1 tab 1 hour later. Then take 1 tablet twice per day until flare is resolved. (Patient not taking: Reported on 02/08/2018) 30 tablet 1   No facility-administered medications prior to visit.     Allergies  Allergen Reactions  . Penicillins     hives    ROS As per HPI  PE: Blood pressure 112/62, pulse 97, temperature 97.9 F (36.6 C), temperature source Oral, resp. rate 16, height 6' (1.829 m), weight 179 lb 4 oz (81.3 kg), SpO2 96 %. Gen: Alert, well appearing.  Patient is oriented to person, place, time, and situation. AFFECT: pleasant, lucid thought and speech. ENT: Ears: EACs clear, normal epithelium.  TMs with good light reflex and landmarks bilaterally.  Eyes: no injection, icteris, swelling, or exudate.  EOMI, PERRLA. Nose: no drainage or turbinate edema/swelling.  No injection  or focal lesion.  Mouth: lips without lesion/swelling.  Oral mucosa pink and moist.  Dentition intact and without obvious caries or gingival swelling.  Oropharynx without erythema, exudate, or swelling.  Neck - No masses or thyromegaly or limitation in range of motion CV: RRR, no m/r/g.   LUNGS: CTA bilat, nonlabored resps, good aeration in all lung fields.  EXT: trace pitting edema in both LLs, R>L.  LABS:  Lab Results  Component Value Date   TSH 1.33 07/07/2012   Lab Results  Component Value Date   WBC 6.9 09/15/2017   HGB 15.0 09/15/2017   HCT 43.2 09/15/2017   MCV 88 09/15/2017   PLT 203 09/15/2017   Lab Results  Component Value Date   CREATININE 1.15 09/15/2017   BUN 20 09/15/2017   NA 144 09/15/2017   K 4.2 09/15/2017   CL 103 09/15/2017   CO2 24 09/15/2017   Lab Results  Component Value Date   ALT 32 09/15/2017   AST 28 09/15/2017   ALKPHOS 76 09/15/2017   BILITOT 0.6 09/15/2017   Lab Results  Component Value Date   CHOL 105 09/15/2017   Lab Results  Component Value Date   HDL 34 (L) 09/15/2017   Lab Results  Component Value Date   LDLCALC 52 09/15/2017   Lab Results  Component Value Date   TRIG 94 09/15/2017   Lab Results  Component Value Date   CHOLHDL 3.1 09/15/2017   Lab Results  Component Value Date   PSA 2.65 05/03/2009   Lab Results  Component Value Date   HGBA1C 5.8 07/08/2012    IMPRESSION AND PLAN:  1) Post-viral fatigue, mild achiness+headaches. Not too sure that acute sinusitis is playing a role but I encouraged him to finish his 2 week course of clinda. Continue rest.  Stop indocin.  Switch back to ibuprofen q6h prn.  2) R Podagra--resolved.  An After Visit Summary was printed and given to the patient.  FOLLOW UP: Return if symptoms worsen or fail to improve.  Signed:  Crissie Sickles, MD           02/08/2018

## 2018-02-28 DIAGNOSIS — C61 Malignant neoplasm of prostate: Secondary | ICD-10-CM | POA: Insufficient documentation

## 2018-03-01 ENCOUNTER — Encounter: Payer: Self-pay | Admitting: Family Medicine

## 2018-03-23 ENCOUNTER — Encounter: Payer: Self-pay | Admitting: Family Medicine

## 2018-04-16 ENCOUNTER — Encounter: Payer: Self-pay | Admitting: Cardiovascular Disease

## 2018-04-16 DIAGNOSIS — I25810 Atherosclerosis of coronary artery bypass graft(s) without angina pectoris: Secondary | ICD-10-CM

## 2018-04-19 NOTE — Telephone Encounter (Signed)
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 He reports his letter requests a regular stress test.  Instructed him to call and clarify so incorrect test is not completed (GXT and myoviews have been alternating). Scheduled patient for OV with Dr. Burt Knack 11/20. He understands he will be called to arrange GXT and fasting labs prior to visit. He will call if myoview needs to be completed instead. He was grateful for assistance.

## 2018-05-17 ENCOUNTER — Telehealth: Payer: Self-pay | Admitting: Cardiovascular Disease

## 2018-05-17 NOTE — Telephone Encounter (Signed)
Pt's pharmacy is stating that irbesartan 300 mg tablets is on back order and wanted to know if Dr. Burt Knack would like to prescribe something else. Please address

## 2018-05-18 NOTE — Telephone Encounter (Signed)
Attempted to call pharmacy but it does not open until 0900.  Will try again later.

## 2018-05-19 ENCOUNTER — Other Ambulatory Visit: Payer: Self-pay | Admitting: Cardiovascular Disease

## 2018-05-20 NOTE — Telephone Encounter (Signed)
Patient called back, he states that he took his last Irbesartan 300MG  last night. The medication is on backorder and patient wants to know if something else can be called in. Valetta Fuller is off and Dr. Burt Knack is out of the office until Monday. Didn't know if another doctor could approve another medication?

## 2018-05-21 ENCOUNTER — Encounter: Payer: Self-pay | Admitting: Cardiovascular Disease

## 2018-05-21 MED ORDER — TELMISARTAN 80 MG PO TABS: 80.0000 mg | ORAL_TABLET | Freq: Every day | ORAL | 1 refills | Status: DC

## 2018-05-21 NOTE — Telephone Encounter (Signed)
See MyChart message

## 2018-05-21 NOTE — Telephone Encounter (Signed)
F/u     Pharmacist called and stated that both medications are out and on back order. She wants to know is there anything else we can use. Pls advise

## 2018-05-24 MED ORDER — VALSARTAN 320 MG PO TABS: 320.0000 mg | ORAL_TABLET | Freq: Every day | ORAL | 1 refills | Status: DC

## 2018-05-24 NOTE — Telephone Encounter (Signed)
Spoke with pharmacy - they have valsartan 320mg  in stock.   Spoke with pt. He is willing to go back on valsartan. He has previously tried ACEi and developed cough. RX sent for valsartan 320mg  daily. He states understanding.

## 2018-06-07 ENCOUNTER — Other Ambulatory Visit: Payer: Self-pay | Admitting: Cardiovascular Disease

## 2018-06-07 DIAGNOSIS — I2510 Atherosclerotic heart disease of native coronary artery without angina pectoris: Secondary | ICD-10-CM

## 2018-06-08 NOTE — Telephone Encounter (Signed)
Outpatient Medication Detail    Disp Refills Start End   amLODipine (NORVASC) 10 MG tablet 90 tablet 2 12/07/2017    Sig - Route: Take 1 tablet (10 mg total) by mouth daily. - Oral   Sent to pharmacy as: amLODipine (NORVASC) 10 MG tablet   E-Prescribing Status: Receipt confirmed by pharmacy (12/07/2017 9:29 AM EST)   Associated Diagnoses   Coronary artery disease involving native coronary artery of native heart without angina pectoris     [place], [place]

## 2018-06-11 ENCOUNTER — Ambulatory Visit (INDEPENDENT_AMBULATORY_CARE_PROVIDER_SITE_OTHER): Payer: Medicare Other | Admitting: Physician Assistant

## 2018-06-11 ENCOUNTER — Other Ambulatory Visit (HOSPITAL_BASED_OUTPATIENT_CLINIC_OR_DEPARTMENT_OTHER): Payer: Medicare Other

## 2018-06-11 ENCOUNTER — Encounter: Payer: Self-pay | Admitting: Physician Assistant

## 2018-06-11 VITALS — BP 100/66 | HR 64 | Temp 98.2°F | Resp 16 | Ht 72.0 in | Wt 179.2 lb

## 2018-06-11 DIAGNOSIS — R350 Frequency of micturition: Secondary | ICD-10-CM | POA: Diagnosis not present

## 2018-06-11 DIAGNOSIS — I2510 Atherosclerotic heart disease of native coronary artery without angina pectoris: Secondary | ICD-10-CM

## 2018-06-11 DIAGNOSIS — R319 Hematuria, unspecified: Secondary | ICD-10-CM

## 2018-06-11 NOTE — Patient Instructions (Signed)
Please go to the lab today for blood work.  I will call you with your results. We will alter treatment regimen(s) if indicated by your results.   Please go to the [facility] for your CT scan. We will call with results.  You need to schedule a follow-up with Urology giving ongoing issues.  If anything acutely worsens, please go to the ER.

## 2018-06-13 LAB — URINE CULTURE
MICRO NUMBER: 90948981
RESULT: NO GROWTH
SPECIMEN QUALITY:: ADEQUATE

## 2018-06-13 LAB — BASIC METABOLIC PANEL
BUN / CREAT RATIO: 21 (calc) (ref 6–22)
BUN: 27 mg/dL — ABNORMAL HIGH (ref 7–25)
CHLORIDE: 108 mmol/L (ref 98–110)
CO2: 30 mmol/L (ref 20–32)
CREATININE: 1.28 mg/dL — AB (ref 0.70–1.18)
Calcium: 9.3 mg/dL (ref 8.6–10.3)
Glucose, Bld: 83 mg/dL (ref 65–99)
Potassium: 4 mmol/L (ref 3.5–5.3)
SODIUM: 144 mmol/L (ref 135–146)

## 2018-06-13 NOTE — Progress Notes (Signed)
This is this provider's first time meeting and assessing patient.   Patient presents to clinic today c/o 2 weeks of intermittent urinary frequency. Patient with history of BPH and  prostatic cancer, undergoing prostatic cryoablation in mid-June. Patient noted initially having issue with inability to urinate after this procedure for 1-2 weeks, having to self-cath per Urology recommendations. Since then had been doing very well except for occasional hematuria which he says his Urologist told him was normal. Now having urinary frequency x 2 weeks with R flank pain x 2 days. Feels that he is completely emptying his bladder. Denies hematuria, urgency, dysuria, nausea/vomiting, fever or chills. Is concerned about infection.   Past Medical History:  Diagnosis Date  . Adenomatous colon polyp 2005  . Blood transfusion 2003  . BPH (benign prostatic hypertrophy)    Hx of post-op acute urinary retention 2019.  [name] Kitchen CAD (coronary artery disease) 2003   CABG 2003  . Chronic renal insufficiency, stage II (mild)    CrCl 60s  . Elevated PSA    Prostate bx x 2 (BPH only).  As of 08/2017, PSA continues to rise +abnl prostate MRI, and Dr. 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  Bx done: +prostate cancer cT1c, intermediate risk categ, desires cryoablation as of 02/28/18 urol f/u.  [name] Kitchen Gout summer 2018   left elbow  . Herpes zoster 12/2014   R side of chin (V3 area)  . Hyperlipidemia   . Hypertension   . Right inguinal hernia    Repaired 2019  . Ulcer 1981  . Vitreous detachment of both eyes    + cataract    Current Outpatient Medications on File Prior to Visit  Medication Sig Dispense Refill  . amLODipine (NORVASC) 10 MG tablet Take 1 tablet (10 mg total) by mouth daily. 90 tablet 2  . aspirin 81 MG tablet Take 81 mg by mouth daily.      . colchicine (COLCRYS) 0.6 MG tablet Take 1 tablet by mouth at onset of gout pain, then 1 tab 1  hour later. Then take 1 tablet twice per day until flare is resolved. 30 tablet 1  . indomethacin (INDOCIN) 50 MG capsule 1 tab po tid as needed for gout pain 30 capsule 1  . OVER THE COUNTER MEDICATION 1 tablet daily.    . rosuvastatin (CRESTOR) 5 MG tablet TAKE ONE TABLET BY MOUTH DAILY FOR CHOLESTEROL 90 tablet 2  . tamsulosin (FLOMAX) 0.4 MG CAPS capsule Take 1 capsule by mouth daily.    . valsartan (DIOVAN) 320 MG tablet Take 1 tablet (320 mg total) by mouth daily. 90 tablet 1  . telmisartan (MICARDIS) 80 MG tablet Take 1 tablet (80 mg total) by mouth daily. (Patient not taking: Reported on 06/11/2018) 90 tablet 1   No current facility-administered medications on file prior to visit.     Allergies  Allergen Reactions  . Penicillins     hives    Family History  Problem Relation Age of Onset  . Heart attack Father 28       suicide post MI  . Heart attack Brother 75  . Aneurysm Mother 57       cns; pacer  . Colon cancer Neg Hx   . Esophageal cancer Neg Hx   . Rectal cancer Neg Hx   . Stomach cancer Neg Hx     Social History   Socioeconomic History  . Marital status: Married  Spouse name: Not on file  . Number of children: Not on file  . Years of education: Not on file  . Highest education level: Not on file  Occupational History  . Not on file  Social Needs  . Financial resource strain: Not on file  . Food insecurity:    Worry: Not on file    Inability: Not on file  . Transportation needs:    Medical: Not on file    Non-medical: Not on file  Tobacco Use  . Smoking status: Never Smoker  . Smokeless tobacco: Never Used  Substance and Sexual Activity  . Alcohol use: No  . Drug use: No  . Sexual activity: Not on file  Lifestyle  . Physical activity:    Days per week: Not on file    Minutes per session: Not on file  . Stress: Not on file  Relationships  . Social connections:    Talks on phone: Not on file    Gets together: Not on file    Attends religious  service: Not on file    Active member of club or organization: Not on file    Attends meetings of clubs or organizations: Not on file    Relationship status: Not on file  Other Topics Concern  . Not on file  Social History Narrative   Married, 2 sons.   Orig from [place].   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   No T/A/Ds.       Review of Systems - See HPI.  All other ROS are negative.  BP 100/66 (BP Location: Left Arm, Patient Position: Sitting, Cuff Size: Normal)   Pulse 64   Temp 98.2 F (36.8 C) (Oral)   Resp 16   Ht 6' (1.829 m)   Wt 179 lb 4 oz (81.3 kg)   SpO2 98%   BMI 24.31 kg/m   Physical Exam  Constitutional: He is oriented to person, place, and time. He appears well-developed and well-nourished.  HENT:  Head: Normocephalic and atraumatic.  Neck: Neck supple.  Cardiovascular: Normal rate, regular rhythm, normal heart sounds and intact distal pulses.  Pulmonary/Chest: Effort normal and breath sounds normal. No stridor. No respiratory distress. He has no wheezes. He has no rales.  Abdominal: Soft. Bowel sounds are normal. He exhibits no distension. There is no tenderness.  Negative CVA tenderness  Neurological: He is alert and oriented to person, place, and time.  Psychiatric: He has a normal mood and affect.    Recent Results (from the past 2160 hour(s))  Urine Culture     Status: None   Collection Time: 06/11/18  3:21 PM  Result Value Ref Range   MICRO NUMBER: [id number]    SPECIMEN QUALITY: ADEQUATE    Sample Source NOT GIVEN    STATUS: FINAL    Result: No Growth   Basic metabolic panel     Status: Abnormal   Collection Time: 06/11/18  3:21 PM  Result Value Ref Range   Glucose, Bld 83 65 - 99 mg/dL    Comment: .            Fasting reference interval .    BUN 27 (H) 7 - 25 mg/dL   Creat 1.28 (H) 0.70 - 1.18 mg/dL    Comment: For patients >45 years of age, the reference limit for Creatinine is approximately 13%  higher for people identified as African-American. .    BUN/Creatinine Ratio 21 6 - 22 (calc)  Sodium 144 135 - 146 mmol/L   Potassium 4.0 3.5 - 5.3 mmol/L   Chloride 108 98 - 110 mmol/L   CO2 30 20 - 32 mmol/L   Calcium 9.3 8.6 - 10.3 mg/dL    Assessment/Plan: 1. Urinary frequency 2. Hematuria, unspecified type UA with trace blood. No sign of infection. Will send for culture. Concern for stone or hydronephrosis giving R flank pain, history of malignancy and recent procedure. Recommend CT scan to assess for stone, etc. Ordered but patient declines. Will check BMP today. Recommended he call his Urologist for further in-depth assessment as he is refusing further assessment here.  - CT RENAL STONE STUDY; Future - Basic metabolic panel   Leeanne Rio, PA-C

## 2018-06-15 ENCOUNTER — Ambulatory Visit: Payer: Medicare Other | Admitting: Family Medicine

## 2018-06-16 ENCOUNTER — Encounter: Payer: Self-pay | Admitting: Family Medicine

## 2018-07-12 ENCOUNTER — Encounter: Payer: Self-pay | Admitting: Family Medicine

## 2018-08-14 ENCOUNTER — Other Ambulatory Visit: Payer: Self-pay | Admitting: Cardiovascular Disease

## 2018-08-31 ENCOUNTER — Other Ambulatory Visit: Payer: Self-pay | Admitting: Cardiovascular Disease

## 2018-08-31 DIAGNOSIS — I2510 Atherosclerotic heart disease of native coronary artery without angina pectoris: Secondary | ICD-10-CM

## 2018-09-06 ENCOUNTER — Other Ambulatory Visit: Payer: Self-pay | Admitting: Cardiovascular Disease

## 2018-09-06 DIAGNOSIS — I2510 Atherosclerotic heart disease of native coronary artery without angina pectoris: Secondary | ICD-10-CM

## 2018-09-07 NOTE — Telephone Encounter (Signed)
Outpatient Medication Detail    Disp Refills Start End   amLODipine (NORVASC) 10 MG tablet 90 tablet 0 08/31/2018    Sig - Route: Take 1 tablet (10 mg total) by mouth daily. Please keep upcoming appt for future refills. Thank you - Oral   Sent to pharmacy as: amLODipine (NORVASC) 10 MG tablet   Notes to Pharmacy: Please keep upcoming appt for future refills. Thank you   E-Prescribing Status: Receipt confirmed by pharmacy (08/31/2018 8:53 AM EDT)   Associated Diagnoses   Coronary artery disease involving native coronary artery of native heart without angina pectoris     [place], [place]

## 2018-09-14 ENCOUNTER — Other Ambulatory Visit: Payer: Medicare Other

## 2018-09-14 ENCOUNTER — Ambulatory Visit (INDEPENDENT_AMBULATORY_CARE_PROVIDER_SITE_OTHER): Payer: Medicare Other

## 2018-09-14 DIAGNOSIS — I25810 Atherosclerosis of coronary artery bypass graft(s) without angina pectoris: Secondary | ICD-10-CM

## 2018-09-14 LAB — COMPREHENSIVE METABOLIC PANEL
ALT: 21 IU/L (ref 0–44)
AST: 18 IU/L (ref 0–40)
Albumin/Globulin Ratio: 2.1 (ref 1.2–2.2)
Albumin: 4.4 g/dL (ref 3.5–4.8)
Alkaline Phosphatase: 62 IU/L (ref 39–117)
BUN/Creatinine Ratio: 20 (ref 10–24)
BUN: 23 mg/dL (ref 8–27)
Bilirubin Total: 0.4 mg/dL (ref 0.0–1.2)
CALCIUM: 9.3 mg/dL (ref 8.6–10.2)
CO2: 27 mmol/L (ref 20–29)
CREATININE: 1.15 mg/dL (ref 0.76–1.27)
Chloride: 107 mmol/L — ABNORMAL HIGH (ref 96–106)
GFR calc non Af Amer: 61 mL/min/{1.73_m2} (ref >59)
GFR, EST AFRICAN AMERICAN: 71 mL/min/{1.73_m2} (ref >59)
GLOBULIN, TOTAL: 2.1 g/dL (ref 1.5–4.5)
Glucose: 92 mg/dL (ref 65–99)
Potassium: 4.5 mmol/L (ref 3.5–5.2)
SODIUM: 150 mmol/L — AB (ref 134–144)
TOTAL PROTEIN: 6.5 g/dL (ref 6.0–8.5)

## 2018-09-14 LAB — CBC WITH DIFFERENTIAL/PLATELET
BASOS ABS: 0.1 10*3/uL (ref 0.0–0.2)
Basos: 1 %
EOS (ABSOLUTE): 0.3 10*3/uL (ref 0.0–0.4)
Eos: 3 %
HEMOGLOBIN: 14.2 g/dL (ref 13.0–17.7)
Hematocrit: 42.3 % (ref 37.5–51.0)
Immature Grans (Abs): 0 10*3/uL (ref 0.0–0.1)
Immature Granulocytes: 0 %
LYMPHS ABS: 1.9 10*3/uL (ref 0.7–3.1)
Lymphs: 22 %
MCH: 30 pg (ref 26.6–33.0)
MCHC: 33.6 g/dL (ref 31.5–35.7)
MCV: 89 fL (ref 79–97)
MONOCYTES: 7 %
Monocytes Absolute: 0.6 10*3/uL (ref 0.1–0.9)
NEUTROS ABS: 5.8 10*3/uL (ref 1.4–7.0)
Neutrophils: 67 %
Platelets: 270 10*3/uL (ref 150–450)
RBC: 4.74 x10E6/uL (ref 4.14–5.80)
RDW: 12.9 % (ref 12.3–15.4)
WBC: 8.8 10*3/uL (ref 3.4–10.8)

## 2018-09-14 LAB — EXERCISE TOLERANCE TEST
CHL CUP MPHR: 144 {beats}/min
CSEPHR: 109 %
Estimated workload: 10.1 METS
Exercise duration (min): 9 min
Exercise duration (sec): 0 s
Peak HR: 157 {beats}/min
RPE: 15
Rest HR: 67 {beats}/min

## 2018-09-14 LAB — LIPID PANEL
Chol/HDL Ratio: 3.4 ratio (ref 0.0–5.0)
Cholesterol, Total: 111 mg/dL (ref 100–199)
HDL: 33 mg/dL — ABNORMAL LOW (ref >39)
LDL Calculated: 60 mg/dL (ref 0–99)
Triglycerides: 88 mg/dL (ref 0–149)
VLDL Cholesterol Cal: 18 mg/dL (ref 5–40)

## 2018-09-16 ENCOUNTER — Ambulatory Visit (INDEPENDENT_AMBULATORY_CARE_PROVIDER_SITE_OTHER): Payer: Medicare Other | Admitting: Cardiovascular Disease

## 2018-09-16 ENCOUNTER — Encounter: Payer: Self-pay | Admitting: Cardiovascular Disease

## 2018-09-16 VITALS — BP 130/80 | HR 66 | Ht 72.0 in | Wt 176.1 lb

## 2018-09-16 DIAGNOSIS — R9430 Abnormal result of cardiovascular function study, unspecified: Secondary | ICD-10-CM | POA: Diagnosis not present

## 2018-09-16 DIAGNOSIS — E782 Mixed hyperlipidemia: Secondary | ICD-10-CM

## 2018-09-16 DIAGNOSIS — I25810 Atherosclerosis of coronary artery bypass graft(s) without angina pectoris: Secondary | ICD-10-CM | POA: Diagnosis not present

## 2018-09-16 DIAGNOSIS — I10 Essential (primary) hypertension: Secondary | ICD-10-CM | POA: Diagnosis not present

## 2018-09-16 NOTE — Progress Notes (Signed)
Cardiology Office Note:    Date:  09/19/2018   ID:  Russell [name], DOB 24-Apr-1941, MRN [id number]  PCP:  Russell Sou, MD  Cardiologist:  No primary care provider on file.  Electrophysiologist:  None   Referring MD: Russell Sou, MD   Chief Complaint  Patient presents with  . Coronary Artery Disease    History of Present Illness:    Russell [name] is a 77 y.o. male with a hx of coronary artery disease, presenting for follow-up evaluation.  The patient has a long history of coronary artery disease and he underwent multivessel CABG in 2003, treated with a LIMA to LAD, vein graft to diagonal, vein graft OM, and sequential vein graft to second and third OM branches.  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  He underwent a stress test prior to his office visit today demonstrating an abnormal study with good exercise tolerance, no angina, no arrhythmia, but 1.5 mm of ST segment depression.  The patient is here alone today.  He underwent treatment for prostate cancer earlier this year and was treated with cryotherapy.  From a cardiac vascular standpoint he is been doing well.  He denies chest pain, shortness of breath, edema, lightheadedness, heart palpitations, orthopnea, or PND.  Past Medical History:  Diagnosis Date  . Adenomatous colon polyp 2005  . Blood transfusion 2003  . BPH (benign prostatic hypertrophy)    Hx of post-op acute urinary retention 2019.  [name] Kitchen CAD (coronary artery disease) 2003   CABG 2003.  ETT POSITIVE 09/2018-->set for myocard perf imaging 10/04/18 and has cards f/u.  [name] Kitchen Chronic renal insufficiency, stage II (mild)    CrCl 60s  . Chronic retention of urine    secondary to severe prostate enlargement and prostate ca--manaed by prostate cryoablation in 04/2018  . Elevated PSA    Prostate bx x 2 (BPH only).  As of 08/2017, PSA continues to rise +abnl prostate MRI, and Russell. Amalia [name] recommended transperineal prostate  bx---pt to defer bx until after his FAA clearance exam (as of 09/2017).  Bx done: +prostate cancer cT1c, intermediate risk categ, desires cryoablation as of 02/28/18 urol f/u.  [name] Kitchen Gout summer 2018   left elbow  . Herpes zoster 12/2014   R side of chin (V3 area)  . Hyperlipidemia   . Hypertension   . Prostate cancer [facility])    Cryoablation 04/2018: doing fine as of urol f/u 06/2018-->[name] for annual PSA surveillance by urol  . Right inguinal hernia    Repaired 2019  . Ulcer 1981  . Vitreous detachment of both eyes    + cataract    Past Surgical History:  Procedure Laterality Date  . BASAL CELL CARCINOMA EXCISION  04/2011   Mohs , Russell [name]  . BIOPSY PROSTATE  approx 2010   Russell. Amalia [name].  [name] Kitchen CARDIOVASCULAR STRESS TEST  08/2014; 08/2015   2015 Low risk: no ischemia.  Inferior/inferolateral scar and slight decreased mobility of LV wall in this region, EF normal.  2016 ETT low risk.  [name] Kitchen CARDIOVASCULAR STRESS TEST  10/06/2016; 09/14/18   EF 45-54%, old MI, low risk study--no change compared to 2015.  09/2018 POSITIVE ETT--cards f/u already.  Myocard perf imaging set for 10/04/18.  [name] Kitchen COLONOSCOPY     12/2011 'tics, int hem, o/w NORMAL.  Repeat 02/2017 showed no polyps, +internal hem and sigmoid diverticulosis.. (negative 2008, polyp 2005; Russell Fuller [name]). No repeat recommended due to age.  . CORONARY ARTERY  BYPASS GRAFT  2003   5 vessel (Russell. Cyndia [name])  . Dent  . ganglion cystectomy      hand  . HEMORRHOID SURGERY     Russell. Harlow [name]  . INGUINAL HERNIA REPAIR     Bilat.  Right (mesh)  . LUMBAR LAMINECTOMY     Russell. Quincy [name]  . POLYPECTOMY    . UMBILICAL HERNIA REPAIR  2018    Current Medications: Current Meds  Medication Sig  . amLODipine (NORVASC) 10 MG tablet Take 1 tablet (10 mg total) by mouth daily. Please keep upcoming appt for future refills. Thank you  . aspirin 81 MG tablet Take 81 mg by mouth daily.    . colchicine (COLCRYS) 0.6 MG tablet Take 1 tablet by mouth at  onset of gout pain, then 1 tab 1 hour later. Then take 1 tablet twice per day until flare is resolved.  . indomethacin (INDOCIN) 50 MG capsule 1 tab po tid as needed for gout pain  . OVER THE COUNTER MEDICATION 1 tablet daily.  . rosuvastatin (CRESTOR) 5 MG tablet TAKE ONE TABLET BY MOUTH DAILY FOR CHOLESTEROL  . valsartan (DIOVAN) 320 MG tablet Take 1 tablet (320 mg total) by mouth daily.     Allergies:   Penicillins   Social History   Socioeconomic History  . Marital status: Married    Spouse name: Not on file  . Number of children: Not on file  . Years of education: Not on file  . Highest education level: Not on file  Occupational History  . Not on file  Social Needs  . Financial resource strain: Not on file  . Food insecurity:    Worry: Not on file    Inability: Not on file  . Transportation needs:    Medical: Not on file    Non-medical: Not on file  Tobacco Use  . Smoking status: Never Smoker  . Smokeless tobacco: Never Used  Substance and Sexual Activity  . Alcohol use: No  . Drug use: No  . Sexual activity: Not on file  Lifestyle  . Physical activity:    Days per week: Not on file    Minutes per session: Not on file  . Stress: Not on file  Relationships  . Social connections:    Talks on phone: Not on file    Gets together: Not on file    Attends religious service: Not on file    Active member of club or organization: Not on file    Attends meetings of clubs or organizations: Not on file    Relationship status: Not on file  Other Topics Concern  . Not on file  Social History Narrative   Married, 2 sons.   Orig from [place].   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   No T/A/Ds.        Family History: The patient's family history includes Aneurysm (age of onset: 4) in his mother; Heart attack (age of onset: 50) in his father; Heart attack (age of onset: 10) in his brother. There is no history of Colon cancer, Esophageal  cancer, Rectal cancer, or Stomach cancer.  ROS:   Please see the history of present illness.    Positive for easy bruising.  All other systems reviewed and are negative.  EKGs/Labs/Other Studies Reviewed:    The following studies were reviewed today: Exercise treadmill stress test 09/14/2018: Study Highlights     Blood  pressure demonstrated a normal response to exercise.  ST segment depression of 1 mm was noted during stress in the II, V4 and V5 leads.   Positive ETT 1-1.5 mm ST depression in lead 2,V4,5 with stress Normal hemodynamic response  No significant arrhythmias    EKG:  EKG is ordered today.  The ekg ordered today demonstrates normal sinus rhythm 66 bpm, within normal limits.  Recent Labs: 09/14/2018: ALT 21; BUN 23; Creatinine, Ser 1.15; Hemoglobin 14.2; Platelets 270; Potassium 4.5; Sodium 150  Recent Lipid Panel    Component Value Date/Time   CHOL 111 09/14/2018 0930   TRIG 88 09/14/2018 0930   HDL 33 (L) 09/14/2018 0930   CHOLHDL 3.4 09/14/2018 0930   CHOLHDL 3.5 10/06/2016 1016   VLDL 18 10/06/2016 1016   LDLCALC 60 09/14/2018 0930    Physical Exam:    VS:  BP 130/80   Pulse 66   Ht 6' (1.829 m)   Wt 176 lb 1.9 oz (79.9 kg)   SpO2 95%   BMI 23.89 kg/m     Wt Readings from Last 3 Encounters:  09/16/18 176 lb 1.9 oz (79.9 kg)  06/11/18 179 lb 4 oz (81.3 kg)  02/08/18 179 lb 4 oz (81.3 kg)     GEN: Well nourished, well developed in no acute distress HEENT: Normal NECK: No JVD; No carotid bruits LYMPHATICS: No lymphadenopathy CARDIAC: RRR, no murmurs, rubs, gallops RESPIRATORY:  Clear to auscultation without rales, wheezing or rhonchi  ABDOMEN: Soft, non-tender, non-distended MUSCULOSKELETAL:  No edema; No deformity  SKIN: Warm and dry NEUROLOGIC:  Alert and oriented x 3 PSYCHIATRIC:  Normal affect   ASSESSMENT:    1. Atherosclerosis of coronary artery bypass graft of native heart without angina pectoris   2. Abnormal exercise  tolerance test   3. Mixed hyperlipidemia   4. Essential hypertension    [name]:    In order of problems listed above:  1. The patient has no symptoms of angina.  His medical program includes aspirin, statin drug, and an ARB.  He will continue on his current medical program. 2. The patient's exercise treadmill study is abnormal.  I personally reviewed the rhythm strips which demonstrate ST segment depression, but normalization in early recovery.  He had no anginal symptoms with exercise.  Will check a Myoview scan to provide imaging data to make sure there is no significant ischemia present.  As long as his Myoview scan is low risk and unchanged from previous studies, I would anticipate ongoing medical therapy. 3. The patient is treated with a statin drug, with dosing limited by patient tolerance.  His LDL cholesterol is at goal at 60 mg/dL.  No changes are made today. 4. Blood pressure is well controlled on valsartan.   Medication Adjustments/Labs and Tests Ordered: Current medicines are reviewed at length with the patient today.  Concerns regarding medicines are outlined above.  Orders Placed This Encounter  Procedures  . Basic metabolic panel  . MYOCARDIAL PERFUSION IMAGING  . EKG 12-Lead   No orders of the defined types were placed in this encounter.   Patient Instructions  Medication Instructions:  Your provider recommends that you continue on your current medications as directed. Please refer to the Current Medication list given to you today.    Labwork: You will have labs drawn when you return for your stress test.  Testing/Procedures: Russell. Burt Knack recommends you have a NUCLEAR STRESS TEST.  Follow-Up: Your provider wants you to follow-up in: 1 year with  Russell. Burt Knack. You will receive a reminder letter in the mail two months in advance. If you don't receive a letter, please call our office to schedule the follow-up appointment.        Signed, Sherren Mocha, MD  09/19/2018  2:28 PM    [place]

## 2018-09-16 NOTE — Patient Instructions (Addendum)
Medication Instructions:  Your provider recommends that you continue on your current medications as directed. Please refer to the Current Medication list given to you today.    Labwork: You will have labs drawn when you return for your stress test.  Testing/Procedures: Dr. Burt Knack recommends you have a NUCLEAR STRESS TEST.  Follow-Up: Your provider wants you to follow-up in: 1 year with Dr. Burt Knack. You will receive a reminder letter in the mail two months in advance. If you don't receive a letter, please call our office to schedule the follow-up appointment.

## 2018-09-20 ENCOUNTER — Other Ambulatory Visit: Payer: Self-pay

## 2018-09-21 ENCOUNTER — Encounter: Payer: Self-pay | Admitting: Family Medicine

## 2018-09-22 ENCOUNTER — Ambulatory Visit: Payer: Medicare Other | Admitting: Cardiovascular Disease

## 2018-09-27 ENCOUNTER — Telehealth (HOSPITAL_COMMUNITY): Payer: Self-pay | Admitting: *Deleted

## 2018-09-27 NOTE — Telephone Encounter (Signed)
Patient given detailed instructions per Myocardial Perfusion Study Information Sheet for the test on 10/04/18 at 0745. Patient notified to arrive 15 minutes early and that it is imperative to arrive on time for appointment to keep from having the test rescheduled.  If you need to cancel or reschedule your appointment, please call the office within 24 hours of your appointment. . Patient verbalized understanding.[name], [place]

## 2018-10-04 ENCOUNTER — Other Ambulatory Visit: Payer: Medicare Other | Admitting: *Deleted

## 2018-10-04 ENCOUNTER — Ambulatory Visit (HOSPITAL_COMMUNITY): Payer: Medicare Other | Attending: Cardiovascular Disease

## 2018-10-04 DIAGNOSIS — I25810 Atherosclerosis of coronary artery bypass graft(s) without angina pectoris: Secondary | ICD-10-CM | POA: Insufficient documentation

## 2018-10-04 DIAGNOSIS — R9430 Abnormal result of cardiovascular function study, unspecified: Secondary | ICD-10-CM | POA: Diagnosis present

## 2018-10-04 LAB — MYOCARDIAL PERFUSION IMAGING
CHL CUP NUCLEAR SDS: 0
CHL CUP NUCLEAR SRS: 0
CHL CUP NUCLEAR SSS: 0
CHL CUP RESTING HR STRESS: 67 {beats}/min
CSEPED: 9 min
CSEPEW: 10.5 METS
CSEPHR: 113 %
Exercise duration (sec): 15 s
LV sys vol: 37 mL
LVDIAVOL: 88 mL (ref 62–150)
MPHR: 144 {beats}/min
Peak HR: 162 {beats}/min
TID: 0.84

## 2018-10-04 MED ORDER — TECHNETIUM TC 99M TETROFOSMIN IV KIT
10.1000 | PACK | Freq: Once | INTRAVENOUS | Status: AC | PRN
  Administered 2018-10-04: 10.1 via INTRAVENOUS
  Filled 2018-10-04: qty 11

## 2018-10-04 MED ORDER — TECHNETIUM TC 99M TETROFOSMIN IV KIT
31.6000 | PACK | Freq: Once | INTRAVENOUS | Status: AC | PRN
  Administered 2018-10-04: 31.6 via INTRAVENOUS
  Filled 2018-10-04: qty 32

## 2018-10-05 LAB — BASIC METABOLIC PANEL
BUN/Creatinine Ratio: 17 (ref 10–24)
BUN: 22 mg/dL (ref 8–27)
CALCIUM: 9.7 mg/dL (ref 8.6–10.2)
CHLORIDE: 104 mmol/L (ref 96–106)
CO2: 21 mmol/L (ref 20–29)
Creatinine, Ser: 1.28 mg/dL — ABNORMAL HIGH (ref 0.76–1.27)
GFR calc Af Amer: 62 mL/min/{1.73_m2} (ref >59)
GFR calc non Af Amer: 54 mL/min/{1.73_m2} — ABNORMAL LOW (ref >59)
Glucose: 91 mg/dL (ref 65–99)
Potassium: 4.2 mmol/L (ref 3.5–5.2)
Sodium: 148 mmol/L — ABNORMAL HIGH (ref 134–144)

## 2018-10-07 ENCOUNTER — Telehealth: Payer: Self-pay | Admitting: Cardiovascular Disease

## 2018-10-07 ENCOUNTER — Encounter: Payer: Self-pay | Admitting: Cardiovascular Disease

## 2018-10-07 NOTE — Telephone Encounter (Signed)
PT AWARE OF MYOVIEW RESULTS AND REQUESTED COPY OF STRESS,MYOVIEW, AND LETTER. ALL WERE FAXED AT [place] NUMBER [phone]

## 2018-10-07 NOTE — Telephone Encounter (Signed)
New Message   Patient is calling in reference to stress test that he had 12/02. So he is calling to pretain the results of the test to give his boss. He needs that information to give to the [place]. Please call to [email or web address].

## 2018-10-11 ENCOUNTER — Encounter: Payer: Self-pay | Admitting: Family Medicine

## 2018-11-06 ENCOUNTER — Other Ambulatory Visit: Payer: Self-pay | Admitting: Cardiovascular Disease

## 2018-11-22 ENCOUNTER — Other Ambulatory Visit: Payer: Self-pay | Admitting: Cardiovascular Disease

## 2018-11-29 ENCOUNTER — Other Ambulatory Visit: Payer: Self-pay | Admitting: Cardiovascular Disease

## 2018-11-29 DIAGNOSIS — I2510 Atherosclerotic heart disease of native coronary artery without angina pectoris: Secondary | ICD-10-CM

## 2018-12-13 ENCOUNTER — Telehealth: Payer: Self-pay | Admitting: Family Medicine

## 2018-12-13 NOTE — Telephone Encounter (Signed)
Copied from [place] [id number]. Topic: General - Other >> Dec 13, 2018 12:43 PM Wynetta Emery, [place] C wrote: Reason for CRM: pt called in just to have things documented that he has a dog bite. Pt says that he isn't currently concerned about the bite. Pt says that the dog has had it's shots and that the puncture that he has isn't concerning. Pt was advised by the nurse to keep the area clean and to follow up with PCP/nurse if needed.

## 2019-05-10 DIAGNOSIS — R972 Elevated prostate specific antigen [PSA]: Secondary | ICD-10-CM

## 2019-05-10 DIAGNOSIS — I25810 Atherosclerosis of coronary artery bypass graft(s) without angina pectoris: Secondary | ICD-10-CM

## 2019-05-11 NOTE — Telephone Encounter (Signed)
Scheduled the patient for yearly labwork (CBC, CMET, lipids) 12/4. Scheduled him for yearly appointment with Dr. Burt Knack 12/7. He will contact the office to notify whether a stress test will need to be scheduled this year.  Dr. Burt Knack- the patient requests a PSA added on to his yearly labs. Is this ok to order or defer to PCP?

## 2019-06-15 ENCOUNTER — Encounter: Payer: Self-pay | Admitting: Family Medicine

## 2019-08-15 ENCOUNTER — Other Ambulatory Visit: Payer: Self-pay | Admitting: Cardiovascular Disease

## 2019-08-27 ENCOUNTER — Other Ambulatory Visit: Payer: Self-pay | Admitting: Cardiovascular Disease

## 2019-08-27 DIAGNOSIS — I2510 Atherosclerotic heart disease of native coronary artery without angina pectoris: Secondary | ICD-10-CM

## 2019-08-29 ENCOUNTER — Other Ambulatory Visit: Payer: Self-pay

## 2019-08-29 DIAGNOSIS — Z20822 Contact with and (suspected) exposure to covid-19: Secondary | ICD-10-CM

## 2019-08-30 LAB — NOVEL CORONAVIRUS, NAA: SARS-CoV-2, NAA: NOT DETECTED

## 2019-09-07 DIAGNOSIS — I25810 Atherosclerosis of coronary artery bypass graft(s) without angina pectoris: Secondary | ICD-10-CM

## 2019-09-09 ENCOUNTER — Telehealth (HOSPITAL_COMMUNITY): Payer: Self-pay | Admitting: *Deleted

## 2019-09-09 ENCOUNTER — Other Ambulatory Visit (HOSPITAL_COMMUNITY)
Admission: RE | Admit: 2019-09-09 | Discharge: 2019-09-09 | Disposition: A | Discharge status: Home / Self Care | Payer: Medicare Other | Source: Ambulatory Visit | Attending: Cardiovascular Disease | Admitting: Cardiovascular Disease

## 2019-09-09 DIAGNOSIS — Z20828 Contact with and (suspected) exposure to other viral communicable diseases: Secondary | ICD-10-CM | POA: Insufficient documentation

## 2019-09-09 DIAGNOSIS — Z01812 Encounter for preprocedural laboratory examination: Secondary | ICD-10-CM | POA: Diagnosis present

## 2019-09-09 NOTE — Telephone Encounter (Signed)
Close encounter 

## 2019-09-10 LAB — NOVEL CORONAVIRUS, NAA (HOSP ORDER, SEND-OUT TO REF LAB; TAT 18-24 HRS): SARS-CoV-2, NAA: NOT DETECTED

## 2019-09-13 ENCOUNTER — Other Ambulatory Visit: Payer: Self-pay

## 2019-09-13 ENCOUNTER — Ambulatory Visit (HOSPITAL_COMMUNITY)
Admission: RE | Admit: 2019-09-13 | Discharge: 2019-09-13 | Disposition: A | Discharge status: Home / Self Care | Payer: Medicare Other | Source: Ambulatory Visit | Attending: Cardiology | Admitting: Cardiology

## 2019-09-13 DIAGNOSIS — I25810 Atherosclerosis of coronary artery bypass graft(s) without angina pectoris: Secondary | ICD-10-CM

## 2019-09-13 LAB — EXERCISE TOLERANCE TEST
Estimated workload: 10.4 METS
Exercise duration (min): 9 min
Exercise duration (sec): 11 s
MPHR: 143 {beats}/min
Peak HR: 153 {beats}/min
Percent HR: 106 %
RPE: 16
Rest HR: 60 {beats}/min

## 2019-09-23 ENCOUNTER — Other Ambulatory Visit: Payer: Self-pay

## 2019-10-07 ENCOUNTER — Other Ambulatory Visit: Payer: Self-pay

## 2019-10-07 ENCOUNTER — Other Ambulatory Visit: Payer: Medicare Other | Admitting: *Deleted

## 2019-10-07 DIAGNOSIS — R972 Elevated prostate specific antigen [PSA]: Secondary | ICD-10-CM

## 2019-10-07 DIAGNOSIS — I25810 Atherosclerosis of coronary artery bypass graft(s) without angina pectoris: Secondary | ICD-10-CM

## 2019-10-07 LAB — CBC WITH DIFFERENTIAL/PLATELET
Basophils Absolute: 0.1 10*3/uL (ref 0.0–0.2)
Basos: 2 %
EOS (ABSOLUTE): 0.2 10*3/uL (ref 0.0–0.4)
Eos: 4 %
Hematocrit: 41.7 % (ref 37.5–51.0)
Hemoglobin: 14.3 g/dL (ref 13.0–17.7)
Immature Grans (Abs): 0 10*3/uL (ref 0.0–0.1)
Immature Granulocytes: 0 %
Lymphocytes Absolute: 1.9 10*3/uL (ref 0.7–3.1)
Lymphs: 31 %
MCH: 31.2 pg (ref 26.6–33.0)
MCHC: 34.3 g/dL (ref 31.5–35.7)
MCV: 91 fL (ref 79–97)
Monocytes Absolute: 0.5 10*3/uL (ref 0.1–0.9)
Monocytes: 8 %
Neutrophils Absolute: 3.3 10*3/uL (ref 1.4–7.0)
Neutrophils: 55 %
Platelets: 191 10*3/uL (ref 150–450)
RBC: 4.58 x10E6/uL (ref 4.14–5.80)
RDW: 12.8 % (ref 11.6–15.4)
WBC: 6 10*3/uL (ref 3.4–10.8)

## 2019-10-07 LAB — COMPREHENSIVE METABOLIC PANEL
ALT: 16 IU/L (ref 0–44)
AST: 21 IU/L (ref 0–40)
Albumin/Globulin Ratio: 1.9 (ref 1.2–2.2)
Albumin: 4.2 g/dL (ref 3.7–4.7)
Alkaline Phosphatase: 65 IU/L (ref 39–117)
BUN/Creatinine Ratio: 18 (ref 10–24)
BUN: 22 mg/dL (ref 8–27)
Bilirubin Total: 0.6 mg/dL (ref 0.0–1.2)
CO2: 25 mmol/L (ref 20–29)
Calcium: 9 mg/dL (ref 8.6–10.2)
Chloride: 105 mmol/L (ref 96–106)
Creatinine, Ser: 1.24 mg/dL (ref 0.76–1.27)
GFR calc Af Amer: 64 mL/min/{1.73_m2} (ref >59)
GFR calc non Af Amer: 55 mL/min/{1.73_m2} — ABNORMAL LOW (ref >59)
Globulin, Total: 2.2 g/dL (ref 1.5–4.5)
Glucose: 90 mg/dL (ref 65–99)
Potassium: 4 mmol/L (ref 3.5–5.2)
Sodium: 143 mmol/L (ref 134–144)
Total Protein: 6.4 g/dL (ref 6.0–8.5)

## 2019-10-07 LAB — LIPID PANEL
Chol/HDL Ratio: 2.7 ratio (ref 0.0–5.0)
Cholesterol, Total: 99 mg/dL — ABNORMAL LOW (ref 100–199)
HDL: 37 mg/dL — ABNORMAL LOW (ref >39)
LDL Chol Calc (NIH): 46 mg/dL (ref 0–99)
Triglycerides: 75 mg/dL (ref 0–149)
VLDL Cholesterol Cal: 16 mg/dL (ref 5–40)

## 2019-10-07 LAB — PSA: Prostate Specific Ag, Serum: 4.4 ng/mL — ABNORMAL HIGH (ref 0.0–4.0)

## 2019-10-10 ENCOUNTER — Encounter: Payer: Self-pay | Admitting: Cardiovascular Disease

## 2019-10-10 ENCOUNTER — Ambulatory Visit (INDEPENDENT_AMBULATORY_CARE_PROVIDER_SITE_OTHER): Payer: Medicare Other | Admitting: Cardiovascular Disease

## 2019-10-10 ENCOUNTER — Other Ambulatory Visit: Payer: Self-pay

## 2019-10-10 VITALS — BP 122/68 | HR 64 | Ht 72.0 in | Wt 185.8 lb

## 2019-10-10 DIAGNOSIS — E782 Mixed hyperlipidemia: Secondary | ICD-10-CM | POA: Diagnosis not present

## 2019-10-10 DIAGNOSIS — I10 Essential (primary) hypertension: Secondary | ICD-10-CM

## 2019-10-10 DIAGNOSIS — R972 Elevated prostate specific antigen [PSA]: Secondary | ICD-10-CM | POA: Diagnosis not present

## 2019-10-10 DIAGNOSIS — I25810 Atherosclerosis of coronary artery bypass graft(s) without angina pectoris: Secondary | ICD-10-CM

## 2019-10-10 NOTE — Progress Notes (Signed)
Cardiology Office Note:    Date:  10/10/2019   ID:  JAMAURIE PFENNING, DOB 1941-02-21, MRN [id number]  PCP:  Tammi Sou, MD  Cardiologist:  Sherren Mocha, MD  Electrophysiologist:  None   Referring MD: Tammi Sou, MD   Chief Complaint  Patient presents with  . Coronary Artery Disease   History of Present Illness:    Russell [name] is a 78 y.o. male with a hx of CAD, presenting for follow-up evaluation. The patient has a long history of coronary artery disease and he underwent multivessel CABG in 2003, treated with a LIMA to LAD, vein graft to diagonal, vein graft OM, and sequential vein graft to second and third OM branches.  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  He has been doing very well and he has no complaints today. Today, he denies symptoms of palpitations, chest pain, shortness of breath, orthopnea, PND, lower extremity edema, dizziness, or syncope. He remains physically active with no exertional symptoms.   Past Medical History:  Diagnosis Date  . Adenomatous colon polyp 2005  . Blood transfusion 2003  . BPH (benign prostatic hypertrophy)    Hx of post-op acute urinary retention 2019.  [name] Kitchen CAD (coronary artery disease) 2003   CABG 2003.  ETT POSITIVE 09/2018-->10/04/2018 myocard perf imaging showed NO ISCHEMIA, EF 55-65%  . Chronic renal insufficiency, stage II (mild)    CrCl 60s  . Chronic retention of urine    secondary to severe prostate enlargement and prostate ca--manaed by prostate cryoablation in 04/2018  . Elevated PSA    Prostate bx x 2 (BPH only).  As of 08/2017, PSA continues to rise +abnl prostate MRI, and Dr. 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  Bx done: +prostate cancer cT1c, intermediate risk categ, desires cryoablation as of 02/28/18 urol f/u.  [name] Kitchen Gout summer 2018   left elbow  . Herpes zoster 12/2014   R side of chin (V3  area)  . Hyperlipidemia   . Hypertension   . Prostate cancer [facility])    Cryoablation 04/2018: doing fine as of urol f/u 06/2018-->plan for annual PSA surveillance by urol  . Right inguinal hernia    Repaired 2019  . Ulcer 1981  . Vitreous detachment of both eyes    + cataract    Past Surgical History:  Procedure Laterality Date  . BASAL CELL CARCINOMA EXCISION  04/2011   Mohs , Dr Harvel Quale  . BIOPSY PROSTATE  approx 2010   Dr. Amalia Hailey.  [name] Kitchen CARDIOVASCULAR STRESS TEST  08/2014; 08/2015   2015 Low risk: no ischemia.  Inferior/inferolateral scar and slight decreased mobility of LV wall in this region, EF normal.  2016 ETT low risk.  [name] Kitchen CARDIOVASCULAR STRESS TEST  10/06/2016; 09/14/18   EF 45-54%, old MI, low risk study--no change compared to 2015.  09/2018 POSITIVE ETT--cards f/u --> 10/04/2018 Myocard perf imaging showed NO ISCHEMIA, EF 55-65%.  . COLONOSCOPY     12/2011 'tics, int hem, o/w NORMAL.  Repeat 02/2017 showed no polyps, +internal hem and sigmoid diverticulosis.. (negative 2008, polyp 2005; Dr Fuller Plan). No repeat recommended due to age.  . CORONARY ARTERY BYPASS GRAFT  2003   5 vessel (Dr. Cyndia Bent)  . [place]  . ganglion cystectomy      hand  . HEMORRHOID SURGERY     Dr. Harlow Asa  . INGUINAL HERNIA  REPAIR     [place].  Right (mesh)  . LUMBAR LAMINECTOMY     Dr. Quincy Carnes  . POLYPECTOMY    . UMBILICAL HERNIA REPAIR  2018    Current Medications: Current Meds  Medication Sig  . amLODipine (NORVASC) 10 MG tablet Take 1 tablet (10 mg total) by mouth daily.  [name] Kitchen aspirin 81 MG tablet Take 81 mg by mouth daily.    [name] Kitchen OVER THE COUNTER MEDICATION 1 tablet daily.  . rosuvastatin (CRESTOR) 5 MG tablet TAKE ONE TABLET BY MOUTH DAILY FOR CHOLESTEROL  . valsartan (DIOVAN) 320 MG tablet Take 1 tablet (320 mg total) by mouth daily.     Allergies:   Penicillins   Social History   Socioeconomic History  . Marital status: Married    Spouse name: Not on file  . Number  of children: Not on file  . Years of education: Not on file  . Highest education level: Not on file  Occupational History  . Not on file  Social Needs  . Financial resource strain: Not on file  . Food insecurity    Worry: Not on file    Inability: Not on file  . Transportation needs    Medical: Not on file    Non-medical: Not on file  Tobacco Use  . Smoking status: Never Smoker  . Smokeless tobacco: Never Used  Substance and Sexual Activity  . Alcohol use: No  . Drug use: No  . Sexual activity: Not on file  Lifestyle  . Physical activity    Days per week: Not on file    Minutes per session: Not on file  . Stress: Not on file  Relationships  . Social Herbalist on phone: Not on file    Gets together: Not on file    Attends religious service: Not on file    Active member of club or organization: Not on file    Attends meetings of clubs or organizations: Not on file    Relationship status: Not on file  Other Topics Concern  . Not on file  Social History Narrative   Married, 2 sons.   Orig from [place].   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   No T/A/Ds.        Family History: The patient's family history includes Aneurysm (age of onset: 32) in his mother; Heart attack (age of onset: 65) in his father; Heart attack (age of onset: 35) in his brother. There is no history of Colon cancer, Esophageal cancer, Rectal cancer, or Stomach cancer.  ROS:   Please see the history of present illness.    All other systems reviewed and are negative.  EKGs/Labs/Other Studies Reviewed:    The following studies were reviewed today: GXT 2019-10-04: Study Highlights   There was no ST segment deviation noted during stress.  Pt walked for 9:11 of a standard Bruce protocol GXT . Peak HR of 153 which is 106% predicted maximal HR.  + Hypertensive response to exercise  There were no ST or T wave changes to suggest ischemia  He had several  PVCs in the immediate recovery phase  This is interpreted as a negative GXT with no evidence of ischemia    Stress Findings  ECG Baseline ECG exhibits normal sinus rhythm..  Stress Findings The patient exercised following the Bruce protocol.    The patient requested the test to be stopped.   85% of maximum heart rate was  achieved after 6 minutes.  Recovery time:  6 minutes.  Multiple rhythm strips per FAA requirements.  Response to Stress There was no ST segment deviation noted during stress.  Arrhythmias during stress:  PVCs.   Arrhythmias during recovery:  none.     Arrhythmias were not significant.   ECG was interpretable and non-conclusive.  Stress Measurements  Baseline Vitals  Rest HR 60 bpm    Rest BP 152/77 mmHg    Exercise Time  Exercise duration (min) 9 min    Exercise duration (sec) 11 sec    Peak Stress Vitals  Peak HR 153 bpm    Peak BP 206/69 mmHg    Exercise Data  MPHR 143 bpm    Percent HR 106 %    RPE 16     Estimated workload 10.4 METS         EKG:  EKG is ordered today.  The ekg ordered today demonstrates NSR 64 bpm, within normal limits  Recent Labs: 10/07/2019: ALT 16; BUN 22; Creatinine, Ser 1.24; Hemoglobin 14.3; Platelets 191; Potassium 4.0; Sodium 143  Recent Lipid Panel    Component Value Date/Time   CHOL 99 (L) 10/07/2019 0922   TRIG 75 10/07/2019 0922   HDL 37 (L) 10/07/2019 0922   CHOLHDL 2.7 10/07/2019 0922   CHOLHDL 3.5 10/06/2016 1016   VLDL 18 10/06/2016 1016   LDLCALC 46 10/07/2019 0922    Physical Exam:    VS:  BP 122/68   Pulse 64   Ht 6' (1.829 m)   Wt 185 lb 12.8 oz (84.3 kg)   SpO2 97%   BMI 25.20 kg/m     Wt Readings from Last 3 Encounters:  10/10/19 185 lb 12.8 oz (84.3 kg)  10/04/18 176 lb (79.8 kg)  09/16/18 176 lb 1.9 oz (79.9 kg)     GEN:  Well nourished, well developed in no acute distress HEENT: Normal NECK: No JVD; No carotid bruits LYMPHATICS: No lymphadenopathy CARDIAC: RRR, no murmurs,  rubs, gallops RESPIRATORY:  Clear to auscultation without rales, wheezing or rhonchi  ABDOMEN: Soft, non-tender, non-distended MUSCULOSKELETAL:  No edema; No deformity  SKIN: Warm and dry NEUROLOGIC:  Alert and oriented x 3 PSYCHIATRIC:  Normal affect   ASSESSMENT:    1. Coronary artery disease involving coronary bypass graft of native heart without angina pectoris   2. Mixed hyperlipidemia   3. Essential hypertension   4. Elevated PSA    PLAN:    In order of problems listed above:  1. The patient remains asymptomatic with good functional capacity on his current medications. He had a hypertensive response to exercise but held his beta blocker 48 hours prior to the stress test. No ischemic changes noted. Continue same medicines and follow up in one year. 2. Lipids at goal on statin Rx. Labs reviewed. LFT's normal, LDL < 70 mg/dL. 3. BP well controlled on current medications. Renal function stable, creatinine 1.24 mg/dL. 4. This was drawn at his request. He will follow-up with his urologist. He is given a copy of the blood work and we discussed findings today.  Medication Adjustments/Labs and Tests Ordered: Current medicines are reviewed at length with the patient today.  Concerns regarding medicines are outlined above.  Orders Placed This Encounter  Procedures  . EKG 12-Lead   No orders of the defined types were placed in this encounter.   Patient Instructions  Medication Instructions:  Your provider recommends that you continue on your current medications as directed. Please refer  to the Current Medication list given to you today.   *If you need a refill on your cardiac medications before your next appointment, please call your pharmacy*  Follow-Up: At [facility], you and your health needs are our priority.  As part of our continuing mission to provide you with exceptional heart care, we have created designated Provider Care Teams.  These Care Teams include your primary  Cardiologist (physician) and Advanced Practice Providers (APPs -  Physician Assistants and Nurse Practitioners) who all work together to provide you with the care you need, when you need it. Your next appointment:   12 month(s) The format for your next appointment:   In Person Provider:   You may see Sherren Mocha, MD or one of the following Advanced Practice Providers on your designated Care Team:    Richardson Dopp, PA-C  Vin Martinsburg, PA-C  Daune Perch, [place]     Signed, Sherren Mocha, MD  10/10/2019 5:22 PM    [facility]

## 2019-10-10 NOTE — Patient Instructions (Signed)
Medication Instructions:  Your provider recommends that you continue on your current medications as directed. Please refer to the Current Medication list given to you today.   *If you need a refill on your cardiac medications before your next appointment, please call your pharmacy*  Follow-Up: At [facility], you and your health needs are our priority.  As part of our continuing mission to provide you with exceptional heart care, we have created designated Provider Care Teams.  These Care Teams include your primary Cardiologist (physician) and Advanced Practice Providers (APPs -  Physician Assistants and Nurse Practitioners) who all work together to provide you with the care you need, when you need it. Your next appointment:   12 month(s) The format for your next appointment:   In Person Provider:   You may see Gervase Cooper, MD or one of the following Advanced Practice Providers on your designated Care Team:    Scott Weaver, PA-C  Vin Bhagat, PA-C  Janine Hammond, NP 

## 2020-02-16 ENCOUNTER — Other Ambulatory Visit: Payer: Self-pay | Admitting: Cardiovascular Disease

## 2020-02-16 DIAGNOSIS — I2510 Atherosclerotic heart disease of native coronary artery without angina pectoris: Secondary | ICD-10-CM

## 2020-05-10 ENCOUNTER — Telehealth: Payer: Self-pay

## 2020-05-10 DIAGNOSIS — I10 Essential (primary) hypertension: Secondary | ICD-10-CM

## 2020-05-10 DIAGNOSIS — R9430 Abnormal result of cardiovascular function study, unspecified: Secondary | ICD-10-CM

## 2020-05-10 DIAGNOSIS — I25810 Atherosclerosis of coronary artery bypass graft(s) without angina pectoris: Secondary | ICD-10-CM

## 2020-05-10 DIAGNOSIS — E782 Mixed hyperlipidemia: Secondary | ICD-10-CM

## 2020-05-10 NOTE — Telephone Encounter (Signed)
Patient will need a nuclear stress test this year as required by the [place]. ETT has been changed to an exercise nuclear stress test.

## 2020-05-10 NOTE — Telephone Encounter (Signed)
Spoke with the patient and FAA is requiring a nuclear stress test this year. Spoke with Angelena Form, PA-C and okay to change orders. Patient is aware and someone will be in contact with him to schedule.

## 2020-05-10 NOTE — Telephone Encounter (Signed)
Covering Dr. Antionette Char basket. Can you arrange an ETT and Lipids, CBC and CMET in early November with an apt with Dr. Burt Knack sometime after that. Orders under Dr. Antionette Char name. Will forward to Lenice Llamas and she can help arrange the apt with [facility]. Thank you!  KT

## 2020-05-10 NOTE — Telephone Encounter (Signed)
Author: Eileen Stanford, PA-C Service: Cardiology Author Type: Physician Assistant Certified  Filed: 02/06/2193 12:25 PM Encounter Date: 05/10/2020 Status: Signed  Editor: Crista Luria (Physician Assistant Certified)      Covering Dr. Antionette Char basket. Can you arrange an ETT and Lipids, CBC and CMET in early November with an apt with Dr. Burt Knack sometime after that. Orders under Dr. Antionette Char name. Will forward to Lenice Llamas and she can help arrange the apt with [facility]. Thank you     Orders have been placed. Message sent to [facility] for scheduling.

## 2020-05-10 NOTE — Addendum Note (Signed)
Addended by: Antonieta Iba on: 05/10/2020 12:59 PM   Modules accepted: Orders

## 2020-05-10 NOTE — Telephone Encounter (Signed)
-----   Message from Eileen Stanford, [place] sent at 05/10/2020 12:23 PM EDT -----   ----- Message ----- From: Antonieta Iba, RN Sent: 05/10/2020   8:32 AM EDT To: Sherren Mocha, MD

## 2020-05-21 NOTE — Telephone Encounter (Signed)
Since the patient has not responded to MyChart message, called and left message for patient to call back or respond to message so appropriate labs will be ordered.

## 2020-06-20 ENCOUNTER — Encounter: Payer: Medicare Other | Admitting: Family Medicine

## 2020-06-21 ENCOUNTER — Encounter: Payer: Self-pay | Admitting: Family Medicine

## 2020-06-21 ENCOUNTER — Ambulatory Visit (INDEPENDENT_AMBULATORY_CARE_PROVIDER_SITE_OTHER): Payer: Medicare Other | Admitting: Family Medicine

## 2020-06-21 ENCOUNTER — Other Ambulatory Visit: Payer: Self-pay

## 2020-06-21 VITALS — BP 124/64 | HR 62 | Temp 98.1°F | Ht 72.0 in | Wt 185.0 lb

## 2020-06-21 DIAGNOSIS — I10 Essential (primary) hypertension: Secondary | ICD-10-CM

## 2020-06-21 DIAGNOSIS — E782 Mixed hyperlipidemia: Secondary | ICD-10-CM | POA: Diagnosis not present

## 2020-06-21 DIAGNOSIS — I2510 Atherosclerotic heart disease of native coronary artery without angina pectoris: Secondary | ICD-10-CM

## 2020-06-21 DIAGNOSIS — C61 Malignant neoplasm of prostate: Secondary | ICD-10-CM | POA: Diagnosis not present

## 2020-06-21 LAB — BASIC METABOLIC PANEL
BUN: 18 mg/dL (ref 6–23)
CO2: 28 mEq/L (ref 19–32)
Calcium: 9.3 mg/dL (ref 8.4–10.5)
Chloride: 105 mEq/L (ref 96–112)
Creatinine, Ser: 1.25 mg/dL (ref 0.40–1.50)
GFR: 55.75 mL/min — ABNORMAL LOW (ref >60.00)
Glucose, Bld: 73 mg/dL (ref 70–99)
Potassium: 4 mEq/L (ref 3.5–5.1)
Sodium: 143 mEq/L (ref 135–145)

## 2020-06-21 LAB — CBC WITH DIFFERENTIAL/PLATELET
Basophils Absolute: 0.1 10*3/uL (ref 0.0–0.1)
Basophils Relative: 1.8 % (ref 0.0–3.0)
Eosinophils Absolute: 0.2 10*3/uL (ref 0.0–0.7)
Eosinophils Relative: 2.8 % (ref 0.0–5.0)
HCT: 42.3 % (ref 39.0–52.0)
Hemoglobin: 14.2 g/dL (ref 13.0–17.0)
Lymphocytes Relative: 33 % (ref 12.0–46.0)
Lymphs Abs: 2.1 10*3/uL (ref 0.7–4.0)
MCHC: 33.7 g/dL (ref 30.0–36.0)
MCV: 90.1 fl (ref 78.0–100.0)
Monocytes Absolute: 0.5 10*3/uL (ref 0.1–1.0)
Monocytes Relative: 7.8 % (ref 3.0–12.0)
Neutro Abs: 3.4 10*3/uL (ref 1.4–7.7)
Neutrophils Relative %: 54.6 % (ref 43.0–77.0)
Platelets: 199 10*3/uL (ref 150.0–400.0)
RBC: 4.7 Mil/uL (ref 4.22–5.81)
RDW: 14.2 % (ref 11.5–15.5)
WBC: 6.3 10*3/uL (ref 4.0–10.5)

## 2020-06-21 LAB — LIPID PANEL
Cholesterol: 107 mg/dL (ref 0–200)
HDL: 31 mg/dL — ABNORMAL LOW (ref >39.00)
LDL Cholesterol: 47 mg/dL (ref 0–99)
NonHDL: 75.67
Total CHOL/HDL Ratio: 3
Triglycerides: 144 mg/dL (ref 0.0–149.0)
VLDL: 28.8 mg/dL (ref 0.0–40.0)

## 2020-06-21 LAB — HEPATIC FUNCTION PANEL
ALT: 17 U/L (ref 0–53)
AST: 18 U/L (ref 0–37)
Albumin: 4.4 g/dL (ref 3.5–5.2)
Alkaline Phosphatase: 58 U/L (ref 39–117)
Bilirubin, Direct: 0.1 mg/dL (ref 0.0–0.3)
Total Bilirubin: 0.4 mg/dL (ref 0.2–1.2)
Total Protein: 6.5 g/dL (ref 6.0–8.3)

## 2020-06-21 LAB — TSH: TSH: 1.54 u[IU]/mL (ref 0.35–4.50)

## 2020-06-21 NOTE — Assessment & Plan Note (Signed)
Pt is s/p ablation but he recently had a risk in PSA.  Has upcoming appt w/ Urology where they will determine if a new bx is needed.  Will follow along.

## 2020-06-21 NOTE — Progress Notes (Signed)
   Subjective:    Patient ID: Russell [name], adult    DOB: August 21, 1941, 79 y.o.   MRN: [id number]  HPI Transfer of Care.  Previous PCP- [facility]  HTN- chronic problem, on Amlodipine 10mg  daily, Valsartan 320mg  daily w/ good control.  No CP, SOB, HAs, visual changes, edema  Hyperlipidemia- chronic problem, on Crestor 5 mg daily.  No abd pain, N/V.  CAD- chronic problem, following w/ Dr Burt Knack (cards).  On ASA, statin, and ARB.  Has FAA stress test upcoming.  No CP.  Hx of prostate cancer- pt has upcoming urology appt and at that time they will determine if bx is needed.  S/p ablation but PSA level did rise recently.  Review of Systems For ROS see HPI   This visit occurred during the SARS-CoV-2 public health emergency.  Safety protocols were in place, including screening questions prior to the visit, additional usage of staff PPE, and extensive cleaning of exam room while observing appropriate contact time as indicated for disinfecting solutions.       Objective:   Physical Exam Vitals reviewed.  Constitutional:      General: He is not in acute distress.    Appearance: Normal appearance. He is well-developed.  HENT:     Head: Normocephalic and atraumatic.  Eyes:     Conjunctiva/sclera: Conjunctivae normal.     Pupils: Pupils are equal, round, and reactive to light.  Neck:     Thyroid: No thyromegaly.  Cardiovascular:     Rate and Rhythm: Normal rate and regular rhythm.     Heart sounds: Normal heart sounds. No murmur heard.   Pulmonary:     Effort: Pulmonary effort is normal. No respiratory distress.     Breath sounds: Normal breath sounds.  Abdominal:     General: Bowel sounds are normal. There is no distension.     Palpations: Abdomen is soft.  Musculoskeletal:     Cervical back: Normal range of motion and neck supple.  Lymphadenopathy:     Cervical: No cervical adenopathy.  Skin:    General: Skin is warm and dry.  Neurological:     Mental Status: He is alert and  oriented to person, place, and time.     Cranial Nerves: No cranial nerve deficit.  Psychiatric:        Behavior: Behavior normal.           Assessment & Plan:

## 2020-06-21 NOTE — Assessment & Plan Note (Signed)
Chronic problem.  Tolerating statin w/o difficulty.  Check labs.  Adjust meds prn  

## 2020-06-21 NOTE — Patient Instructions (Signed)
Follow up in 6 months to recheck cholesterol and BP We'll notify you of your lab results and make any changes if needed Keep up the good work on healthy diet and regular exercise- you can do it! Call with any questions or concerns Stay Safe!  Stay Healthy!

## 2020-06-21 NOTE — Assessment & Plan Note (Signed)
Chronic problem, following w/ Dr Burt Knack.  On ASA, statin to reduce risk.  Will follow along.

## 2020-06-21 NOTE — Assessment & Plan Note (Signed)
Chronic problem.  Well controlled.  Currently asymptomatic.  Check labs.  No anticipated med changes.  Will follow. 

## 2020-07-27 NOTE — Progress Notes (Deleted)
Subjective:   Russell [name] is a 79 y.o. male who presents for an Initial Medicare Annual Wellness Visit.  I connected with Russell [name] today by telephone and verified that I am speaking with the correct person using two identifiers. Location patient: home Location provider: work Persons participating in the virtual visit: patient, Russell [name].    I discussed the limitations, risks, security and privacy concerns of performing an evaluation and management service by telephone and the availability of in person appointments. I also discussed with the patient that there may be a patient responsible charge related to this service. The patient expressed understanding and verbally consented to this telephonic visit.    Interactive audio and video telecommunications were attempted between this provider and patient, however failed, due to patient having technical difficulties OR patient did not have access to video capability.  We continued and completed visit with audio only.  Some vital signs may be absent or patient reported.   Time Spent with patient on telephone encounter: *** minutes   Review of Systems    ***       Objective:    There were no vitals filed for this visit. There is no height or weight on file to calculate BMI.  Advanced Directives 03/03/2017 02/17/2017  Does Patient Have a Medical Advance Directive? Yes Yes    Current Medications (verified) Outpatient Encounter Medications as of 07/30/2020  Medication Sig  . amLODipine (NORVASC) 10 MG tablet Take 1 tablet (10 mg total) by mouth daily.  [name] Kitchen aspirin 81 MG tablet Take 81 mg by mouth daily.    [name] Kitchen OVER THE COUNTER MEDICATION 1 tablet daily.  . rosuvastatin (CRESTOR) 5 MG tablet TAKE ONE TABLET BY MOUTH DAILY FOR CHOLESTEROL  . valsartan (DIOVAN) 320 MG tablet Take 1 tablet (320 mg total) by mouth daily.   No facility-administered encounter medications on file as of 07/30/2020.    Allergies (verified) Penicillins    History: Past Medical History:  Diagnosis Date  . Adenomatous colon polyp 2005  . Blood transfusion 2003  . BPH (benign prostatic hypertrophy)    Hx of post-op acute urinary retention 2019.  [name] Kitchen CAD (coronary artery disease) 2003   CABG 2003.  ETT POSITIVE 09/2018-->10/04/2018 myocard perf imaging showed NO ISCHEMIA, EF 55-65%  . Chronic renal insufficiency, stage II (mild)    CrCl 60s  . Chronic retention of urine    secondary to severe prostate enlargement and prostate ca--manaed by prostate cryoablation in 04/2018  . Elevated PSA    Prostate bx x 2 (BPH only).  As of 08/2017, PSA continues to rise +abnl prostate MRI, and Dr. 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  Bx done: +prostate cancer cT1c, intermediate risk categ, desires cryoablation as of 02/28/18 urol f/u.  [name] Kitchen Gout summer 2018   left elbow  . Herpes zoster 12/2014   R side of chin (V3 area)  . Hyperlipidemia   . Hypertension   . Prostate cancer [facility])    Cryoablation 04/2018: doing fine as of urol f/u 06/2018-->plan for annual PSA surveillance by urol  . Right inguinal hernia    Repaired 2019  . Ulcer 1981  . Vitreous detachment of both eyes    + cataract   Past Surgical History:  Procedure Laterality Date  . BASAL CELL CARCINOMA EXCISION  04/2011   Mohs , Dr Harvel Quale  . BIOPSY PROSTATE  approx 2010   Dr. Amalia Hailey.  [name] Kitchen CARDIOVASCULAR  STRESS TEST  08/2014; 08/2015   2015 Low risk: no ischemia.  Inferior/inferolateral scar and slight decreased mobility of LV wall in this region, EF normal.  2016 ETT low risk.  [name] Kitchen CARDIOVASCULAR STRESS TEST  10/06/2016; 09/14/18   EF 45-54%, old MI, low risk study--no change compared to 2015.  09/2018 POSITIVE ETT--cards f/u --> 10/04/2018 Myocard perf imaging showed NO ISCHEMIA, EF 55-65%.  . COLONOSCOPY     12/2011 'tics, int hem, o/w NORMAL.  Repeat 02/2017 showed no polyps, +internal hem and sigmoid diverticulosis..  (negative 2008, polyp 2005; Dr Fuller Plan). No repeat recommended due to age.  . CORONARY ARTERY BYPASS GRAFT  2003   5 vessel (Dr. Cyndia Bent)  . [place]  . ganglion cystectomy      hand  . HEMORRHOID SURGERY     Dr. Harlow Asa  . INGUINAL HERNIA REPAIR     Bilat.  Right (mesh)  . LUMBAR LAMINECTOMY     Dr. Quincy Carnes  . POLYPECTOMY    . UMBILICAL HERNIA REPAIR  2018   Family History  Problem Relation Age of Onset  . Heart attack Father 20       suicide post MI  . Heart attack Brother 25  . Aneurysm Mother 56       cns; pacer  . Colon cancer Neg Hx   . Esophageal cancer Neg Hx   . Rectal cancer Neg Hx   . Stomach cancer Neg Hx    Social History   Socioeconomic History  . Marital status: Married    Spouse name: Not on file  . Number of children: Not on file  . Years of education: Not on file  . Highest education level: Not on file  Occupational History  . Not on file  Tobacco Use  . Smoking status: Never Smoker  . Smokeless tobacco: Never Used  Substance and Sexual Activity  . Alcohol use: No  . Drug use: No  . Sexual activity: Not on file  Other Topics Concern  . Not on file  Social History Narrative   Married, 2 sons.   Orig from [place].   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   No T/A/Ds.      Social Determinants of Health   Financial Resource Strain:   . Difficulty of Paying Living Expenses: Not on file  Food Insecurity:   . Worried About Charity fundraiser in the Last Year: Not on file  . Ran Out of Food in the Last Year: Not on file  Transportation Needs:   . Lack of Transportation (Medical): Not on file  . Lack of Transportation (Non-Medical): Not on file  Physical Activity:   . Days of Exercise per Week: Not on file  . Minutes of Exercise per Session: Not on file  Stress:   . Feeling of Stress : Not on file  Social Connections:   . Frequency of Communication with Friends and Family: Not on  file  . Frequency of Social Gatherings with Friends and Family: Not on file  . Attends Religious Services: Not on file  . Active Member of Clubs or Organizations: Not on file  . Attends Archivist Meetings: Not on file  . Marital Status: Not on file    Tobacco Counseling Counseling given: Not Answered   Clinical Intake:                 Diabetic?No  Activities of Daily Living In your present state of health, do you have any difficulty performing the following activities: 06/21/2020  Hearing? N  Vision? N  Difficulty concentrating or making decisions? N  Walking or climbing stairs? N  Dressing or bathing? N  Doing errands, shopping? N  Some recent data might be hidden    Patient Care Team: Midge Minium, MD as PCP - General (Family Medicine) Sherren Mocha, MD as PCP - Cardiology (Cardiology) Sherren Mocha, MD as Consulting Physician (Cardiology) Druscilla Brownie, MD as Consulting Physician (Dermatology) Ladene Artist, MD as Consulting Physician (Gastroenterology) Armandina Gemma, MD as Consulting Physician (General Surgery) Midge Minium, MD as Consulting Physician (Family Medicine) Darra Lis, MD as Referring Physician (Urology)  Indicate any recent Medical Services you may have received from other than [facility] providers in the past year (date may be approximate).     Assessment:   This is a routine wellness examination for Phinehas.  Hearing/Vision screen No exam data present  Dietary issues and exercise activities discussed:    Goals   None    Depression Screen PHQ 2/9 Scores 06/21/2020  PHQ - 2 Score 0  PHQ- 9 Score 0    Fall Risk Fall Risk  06/21/2020 09/23/2019 09/20/2018  Falls in the past year? 0 0 0  Comment - Emmi Telephone Survey: data to providers prior to load Emmi Telephone Survey: data to providers prior to load  Number falls in past yr: 0 - -  Injury with Fall? 0 - -    Any stairs in or  around the home? {YES/NO:21197} If so, are there any without handrails? {YES/NO:21197} Home free of loose throw rugs in walkways, pet beds, electrical cords, etc? {YES/NO:21197} Adequate lighting in your home to reduce risk of falls? {YES/NO:21197}  ASSISTIVE DEVICES UTILIZED TO PREVENT FALLS:  Life alert? {YES/NO:21197} Use of a cane, walker or w/c? {YES/NO:21197} Grab bars in the bathroom? {YES/NO:21197} Shower chair or bench in shower? {YES/NO:21197} Elevated toilet seat or a handicapped toilet? {YES/NO:21197}  TIMED UP AND GO:  Was the test performed? No .phone visit  Cognitive Function:        Immunizations Immunization History  Administered Date(s) Administered  . Influenza Split 07/30/2011, 07/20/2012  . Influenza Whole 09/03/1998, 08/17/2007, 08/03/2008, 08/02/2009, 07/17/2010  . Influenza, High Dose Seasonal PF 08/22/2013  . Influenza,inj,Quad PF,6+ Mos 08/07/2014, 08/13/2015  . Influenza-Unspecified 08/18/2017, 08/24/2018, 08/15/2019  . Moderna SARS-COVID-2 Vaccination 12/12/2019, 01/09/2020  . Pneumococcal Conjugate-13 07/31/2017  . Pneumococcal Polysaccharide-23 11/03/2006  . Td 08/03/2000, 08/02/2009  . Tdap 09/19/2019  . Zoster Recombinat (Shingrix) 06/23/2018, 09/06/2018    TDAP status: Up to date    {Flu Vaccine status:2101806}   Pneumococcal vaccine status: Up to date   Covid-19 vaccine status: Completed vaccines  Qualifies for Shingles Vaccine? No   Zostavax completed No   Shingrix Completed?: Yes  Screening Tests Health Maintenance  Topic Date Due  . Hepatitis C Screening  Never done  . DEXA SCAN  Never done  . INFLUENZA VACCINE  06/03/2020  . COLONOSCOPY  03/03/2022  . TETANUS/TDAP  09/18/2029  . COVID-19 Vaccine  Completed  . PNA vac Low Risk Adult  Completed    Health Maintenance  Health Maintenance Due  Topic Date Due  . Hepatitis C Screening  Never done  . DEXA SCAN  Never done  . INFLUENZA VACCINE  06/03/2020     Colorectal cancer screening: No longer required.   Lung Cancer Screening: (Low Dose CT Chest  recommended if Age 9-80 years, 50 pack-year currently smoking OR have quit w/in 15years.) does not qualify.     Additional Screening:  Hepatitis C Screening: {DOES NOT does:27190::"does not"} qualify; Completed ***  Vision Screening: Recommended annual ophthalmology exams for early detection of glaucoma and other disorders of the eye. Is the patient up to date with their annual eye exam?  {YES/NO:21197} Who is the provider or what is the name of the office in which the patient attends annual eye exams? *** If pt is not established with a provider, would they like to be referred to a provider to establish care? {YES/NO:21197}.   Dental Screening: Recommended annual dental exams for proper oral hygiene  Community Resource Referral / Chronic Care Management: CRR required this visit?  {YES/NO:21197}  CCM required this visit?  {YES/NO:21197}     Plan:     I have personally reviewed and noted the following in the patient's chart:   . Medical and social history . Use of alcohol, tobacco or illicit drugs  . Current medications and supplements . Functional ability and status . Nutritional status . Physical activity . Advanced directives . List of other physicians . Hospitalizations, surgeries, and ER visits in previous 12 months . Vitals . Screenings to include cognitive, depression, and falls . Referrals and appointments  In addition, I have reviewed and discussed with patient certain preventive protocols, quality metrics, and best practice recommendations. A written personalized care plan for preventive services as well as general preventive health recommendations were provided to patient.    Due to this being a telephonic visit, the after visit summary with patients personalized plan was offered to patient via mail or my-chart. Patient would like to access on my-chart.  Marta Antu, LPN   05/05/4034  Nurse health Advisor  Nurse Notes: ***

## 2020-07-30 ENCOUNTER — Ambulatory Visit: Payer: Medicare Other

## 2020-08-21 ENCOUNTER — Telehealth: Payer: Self-pay | Admitting: Family Medicine

## 2020-08-21 NOTE — Telephone Encounter (Signed)
Spoke with patient and he declined the AWV and do not want anymore calls for it

## 2020-09-03 ENCOUNTER — Telehealth (HOSPITAL_COMMUNITY): Payer: Self-pay | Admitting: *Deleted

## 2020-09-03 NOTE — Telephone Encounter (Signed)
Patient given detailed instructions per Myocardial Perfusion Study Information Sheet for the test on 09/07/20 at 7:45. Patient notified to arrive 15 minutes early and that it is imperative to arrive on time for appointment to keep from having the test rescheduled.  If you need to cancel or reschedule your appointment, please call the office within 24 hours of your appointment. . Patient verbalized understanding.Russell [name]

## 2020-09-05 ENCOUNTER — Encounter (HOSPITAL_COMMUNITY): Payer: Medicare Other

## 2020-09-05 ENCOUNTER — Other Ambulatory Visit: Payer: Medicare Other

## 2020-09-07 ENCOUNTER — Other Ambulatory Visit: Payer: Self-pay

## 2020-09-07 ENCOUNTER — Other Ambulatory Visit: Payer: Medicare Other | Admitting: *Deleted

## 2020-09-07 ENCOUNTER — Telehealth: Payer: Self-pay | Admitting: Cardiology

## 2020-09-07 ENCOUNTER — Ambulatory Visit (HOSPITAL_COMMUNITY): Payer: Medicare Other | Attending: Cardiology

## 2020-09-07 DIAGNOSIS — R9430 Abnormal result of cardiovascular function study, unspecified: Secondary | ICD-10-CM | POA: Insufficient documentation

## 2020-09-07 DIAGNOSIS — I25810 Atherosclerosis of coronary artery bypass graft(s) without angina pectoris: Secondary | ICD-10-CM

## 2020-09-07 DIAGNOSIS — E782 Mixed hyperlipidemia: Secondary | ICD-10-CM

## 2020-09-07 DIAGNOSIS — I10 Essential (primary) hypertension: Secondary | ICD-10-CM

## 2020-09-07 LAB — MYOCARDIAL PERFUSION IMAGING
Estimated workload: 10.1 METS
Exercise duration (min): 9 min
Exercise duration (sec): 0 s
LV dias vol: 106 mL (ref 62–150)
LV sys vol: 40 mL (ref 21–61)
MPHR: 142 {beats}/min
Peak HR: 150 {beats}/min
Percent HR: 105 %
Rest HR: 55 {beats}/min
SDS: 0
SRS: 0
SSS: 0
TID: 0.93

## 2020-09-07 LAB — COMPREHENSIVE METABOLIC PANEL
ALT: 17 IU/L (ref 0–44)
AST: 19 IU/L (ref 0–40)
Albumin/Globulin Ratio: 2.2 (ref 1.2–2.2)
Albumin: 4.4 g/dL (ref 3.7–4.7)
Alkaline Phosphatase: 68 IU/L (ref 44–121)
BUN/Creatinine Ratio: 13 (ref 10–24)
BUN: 15 mg/dL (ref 8–27)
Bilirubin Total: 0.5 mg/dL (ref 0.0–1.2)
CO2: 28 mmol/L (ref 20–29)
Calcium: 9.2 mg/dL (ref 8.6–10.2)
Chloride: 105 mmol/L (ref 96–106)
Creatinine, Ser: 1.2 mg/dL (ref 0.76–1.27)
GFR calc Af Amer: 67 mL/min/{1.73_m2} (ref >59)
GFR calc non Af Amer: 58 mL/min/{1.73_m2} — ABNORMAL LOW (ref >59)
Globulin, Total: 2 g/dL (ref 1.5–4.5)
Glucose: 90 mg/dL (ref 65–99)
Potassium: 3.8 mmol/L (ref 3.5–5.2)
Sodium: 144 mmol/L (ref 134–144)
Total Protein: 6.4 g/dL (ref 6.0–8.5)

## 2020-09-07 LAB — CBC
Hematocrit: 43.4 % (ref 37.5–51.0)
Hemoglobin: 14.4 g/dL (ref 13.0–17.7)
MCH: 29.9 pg (ref 26.6–33.0)
MCHC: 33.2 g/dL (ref 31.5–35.7)
MCV: 90 fL (ref 79–97)
Platelets: 201 10*3/uL (ref 150–450)
RBC: 4.81 x10E6/uL (ref 4.14–5.80)
RDW: 13 % (ref 11.6–15.4)
WBC: 5.7 10*3/uL (ref 3.4–10.8)

## 2020-09-07 LAB — LIPID PANEL
Chol/HDL Ratio: 2.9 ratio (ref 0.0–5.0)
Cholesterol, Total: 106 mg/dL (ref 100–199)
HDL: 36 mg/dL — ABNORMAL LOW (ref >39)
LDL Chol Calc (NIH): 54 mg/dL (ref 0–99)
Triglycerides: 78 mg/dL (ref 0–149)
VLDL Cholesterol Cal: 16 mg/dL (ref 5–40)

## 2020-09-07 MED ORDER — TECHNETIUM TC 99M TETROFOSMIN IV KIT
10.4000 | PACK | Freq: Once | INTRAVENOUS | Status: AC | PRN
  Administered 2020-09-07: 10.4 via INTRAVENOUS
  Filled 2020-09-07: qty 11

## 2020-09-07 MED ORDER — TECHNETIUM TC 99M TETROFOSMIN IV KIT
31.7000 | PACK | Freq: Once | INTRAVENOUS | Status: AC | PRN
  Administered 2020-09-07: 31.7 via INTRAVENOUS
  Filled 2020-09-07: qty 32

## 2020-09-07 NOTE — Telephone Encounter (Signed)
I called pt back, and left message, he should be in stress test now.  I asked him to call back if questions.

## 2020-09-10 ENCOUNTER — Encounter: Payer: Self-pay | Admitting: Cardiovascular Disease

## 2020-09-10 ENCOUNTER — Ambulatory Visit (INDEPENDENT_AMBULATORY_CARE_PROVIDER_SITE_OTHER): Payer: Medicare Other | Admitting: Cardiovascular Disease

## 2020-09-10 ENCOUNTER — Other Ambulatory Visit: Payer: Self-pay

## 2020-09-10 VITALS — BP 130/72 | HR 59 | Ht 72.0 in | Wt 186.0 lb

## 2020-09-10 DIAGNOSIS — E782 Mixed hyperlipidemia: Secondary | ICD-10-CM

## 2020-09-10 DIAGNOSIS — I10 Essential (primary) hypertension: Secondary | ICD-10-CM | POA: Diagnosis not present

## 2020-09-10 DIAGNOSIS — I25810 Atherosclerosis of coronary artery bypass graft(s) without angina pectoris: Secondary | ICD-10-CM

## 2020-09-10 NOTE — Progress Notes (Signed)
Cardiology Office Note:    Date:  09/11/2020   ID:  Russell [name], DOB 09-10-1941, MRN [id number]  PCP:  Midge Minium, MD  [facility] Cardiologist:  Sherren Mocha, MD  [place] Electrophysiologist:  None   Referring MD: Tammi Sou, MD   Chief Complaint  Patient presents with  . Coronary Artery Disease    History of Present Illness:    Russell [name] is a 79 y.o. adult with a hx of coronary artery disease, presenting for his annual cardiology follow-up evaluation.  The patient underwent multivessel CABG in 2003 with a LIMA to LAD graft, saphenous vein graft to diagonal, saphenous vein graft to circumflex obtuse marginal, and sequential saphenous vein graft to the second and third obtuse marginal branches.  The patient has undergone annual stress testing in order to comply with FAA requirements.  The patient is here alone today.  He has completed his exercise Myoview stress test as well as his lab work.  He has no specific cardiovascular-related complaints today.  He denies chest pain, chest pressure, shortness of breath, heart palpitations, orthopnea, PND, lower extremity swelling, or syncope.  He has no exertional symptoms.  His stress test demonstrated no findings of myocardial ischemia.  He was able to exercise for 9 minutes according to the Bruce protocol.  Past Medical History:  Diagnosis Date  . Adenomatous colon polyp 2005  . Blood transfusion 2003  . BPH (benign prostatic hypertrophy)    Hx of post-op acute urinary retention 2019.  [name] Kitchen CAD (coronary artery disease) 2003   CABG 2003.  ETT POSITIVE 09/2018-->10/04/2018 myocard perf imaging showed NO ISCHEMIA, EF 55-65%  . Chronic renal insufficiency, stage II (mild)    CrCl 60s  . Chronic retention of urine    secondary to severe prostate enlargement and prostate ca--manaed by prostate cryoablation in 04/2018  . Elevated PSA    Prostate bx x 2 (BPH only).  As of 08/2017, PSA continues to rise +abnl  prostate MRI, and Dr. 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  Bx done: +prostate cancer cT1c, intermediate risk categ, desires cryoablation as of 02/28/18 urol f/u.  [name] Kitchen Gout summer 2018   left elbow  . Herpes zoster 12/2014   R side of chin (V3 area)  . Hyperlipidemia   . Hypertension   . Prostate cancer [facility])    Cryoablation 04/2018: doing fine as of urol f/u 06/2018-->plan for annual PSA surveillance by urol  . Right inguinal hernia    Repaired 2019  . Ulcer 1981  . Vitreous detachment of both eyes    + cataract    Past Surgical History:  Procedure Laterality Date  . BASAL CELL CARCINOMA EXCISION  04/2011   Mohs , Dr Harvel Quale  . BIOPSY PROSTATE  approx 2010   Dr. Amalia Hailey.  [name] Kitchen CARDIOVASCULAR STRESS TEST  08/2014; 08/2015   2015 Low risk: no ischemia.  Inferior/inferolateral scar and slight decreased mobility of LV wall in this region, EF normal.  2016 ETT low risk.  [name] Kitchen CARDIOVASCULAR STRESS TEST  10/06/2016; 09/14/18   EF 45-54%, old MI, low risk study--no change compared to 2015.  09/2018 POSITIVE ETT--cards f/u --> 10/04/2018 Myocard perf imaging showed NO ISCHEMIA, EF 55-65%.  . COLONOSCOPY     12/2011 'tics, int hem, o/w NORMAL.  Repeat 02/2017 showed no polyps, +internal hem and sigmoid diverticulosis.. (negative 2008, polyp 2005; Dr Fuller Plan). No repeat recommended due to age.  [name] Kitchen  CORONARY ARTERY BYPASS GRAFT  2003   5 vessel (Dr. Cyndia Bent)  . [place]  . ganglion cystectomy      hand  . HEMORRHOID SURGERY     Dr. Harlow Asa  . INGUINAL HERNIA REPAIR     Bilat.  Right (mesh)  . LUMBAR LAMINECTOMY     Dr. Quincy Carnes  . POLYPECTOMY    . UMBILICAL HERNIA REPAIR  2018    Current Medications: Current Meds  Medication Sig  . amLODipine (NORVASC) 10 MG tablet Take 1 tablet (10 mg total) by mouth daily.  [name] Kitchen aspirin 81 MG tablet Take 81 mg by mouth daily.    [name] Kitchen OVER THE COUNTER MEDICATION 1 tablet  daily.  . rosuvastatin (CRESTOR) 5 MG tablet TAKE ONE TABLET BY MOUTH DAILY FOR CHOLESTEROL  . valsartan (DIOVAN) 320 MG tablet Take 1 tablet (320 mg total) by mouth daily.     Allergies:   Penicillins   Social History   Socioeconomic History  . Marital status: Married    Spouse name: Not on file  . Number of children: Not on file  . Years of education: Not on file  . Highest education level: Not on file  Occupational History  . Not on file  Tobacco Use  . Smoking status: Never Smoker  . Smokeless tobacco: Never Used  Substance and Sexual Activity  . Alcohol use: No  . Drug use: No  . Sexual activity: Not on file  Other Topics Concern  . Not on file  Social History Narrative   Married, 2 sons.   Orig from [place].   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   No T/A/Ds.      Social Determinants of Health   Financial Resource Strain:   . Difficulty of Paying Living Expenses: Not on file  Food Insecurity:   . Worried About Charity fundraiser in the Last Year: Not on file  . Ran Out of Food in the Last Year: Not on file  Transportation Needs:   . Lack of Transportation (Medical): Not on file  . Lack of Transportation (Non-Medical): Not on file  Physical Activity:   . Days of Exercise per Week: Not on file  . Minutes of Exercise per Session: Not on file  Stress:   . Feeling of Stress : Not on file  Social Connections:   . Frequency of Communication with Friends and Family: Not on file  . Frequency of Social Gatherings with Friends and Family: Not on file  . Attends Religious Services: Not on file  . Active Member of Clubs or Organizations: Not on file  . Attends Archivist Meetings: Not on file  . Marital Status: Not on file     Family History: The patient's family history includes Aneurysm (age of onset: 13) in his mother; Heart attack (age of onset: 37) in his father; Heart attack (age of onset: 44) in his brother. There  is no history of Colon cancer, Esophageal cancer, Rectal cancer, or Stomach cancer.  ROS:   Please see the history of present illness.    All other systems reviewed and are negative.  EKGs/Labs/Other Studies Reviewed:    The following studies were reviewed today: Myocardial perfusion study 09/07/2020: Study Highlights    Nuclear stress EF: 63%.  Blood pressure demonstrated a normal response to exercise.  There was no ST segment deviation noted during stress.  Defect 1: There is a  small defect of moderate severity present in the basal inferior location.  The study is normal.  This is a low risk study.   Normal stress nuclear study with mild inferobasal thinning but no ischemia.  Gated ejection fraction 63% with normal wall motion.   EKG:  EKG is ordered today.  The ekg ordered today demonstrates normal sinus rhythm 59 bpm, first-degree AV block, otherwise within normal limits.  No significant change from previous tracings.  Recent Labs: 06/21/2020: TSH 1.54 09/07/2020: ALT 17; BUN 15; Creatinine, Ser 1.20; Hemoglobin 14.4; Platelets 201; Potassium 3.8; Sodium 144  Recent Lipid Panel    Component Value Date/Time   CHOL 106 09/07/2020 0746   TRIG 78 09/07/2020 0746   HDL 36 (L) 09/07/2020 0746   CHOLHDL 2.9 09/07/2020 0746   CHOLHDL 3 06/21/2020 1305   VLDL 28.8 06/21/2020 1305   LDLCALC 54 09/07/2020 0746     Risk Assessment/Calculations:       Physical Exam:    VS:  BP 130/72   Pulse (!) 59   Ht 6' (1.829 m)   Wt 186 lb (84.4 kg)   SpO2 94%   BMI 25.23 kg/m     Wt Readings from Last 3 Encounters:  09/10/20 186 lb (84.4 kg)  09/07/20 185 lb (83.9 kg)  06/21/20 185 lb (83.9 kg)     GEN:  Well nourished, well developed in no acute distress HEENT: Normal NECK: No JVD; No carotid bruits LYMPHATICS: No lymphadenopathy CARDIAC: RRR, no murmurs, rubs, gallops RESPIRATORY:  Clear to auscultation without rales, wheezing or rhonchi  ABDOMEN: Soft,  non-tender, non-distended MUSCULOSKELETAL:  No edema; No deformity  SKIN: Warm and dry NEUROLOGIC:  Alert and oriented x 3 PSYCHIATRIC:  Normal affect   ASSESSMENT:    1. Coronary artery disease involving coronary bypass graft of native heart without angina pectoris   2. Mixed hyperlipidemia   3. Essential hypertension    PLAN:    In order of problems listed above:  1. The patient remains clinically stable.  His physical exam is unchanged.  He has no symptoms of exertional angina.  His nuclear stress test is reviewed and it demonstrates no evidence of myocardial ischemia at a good workload with achievement of appropriate heart rate.  He will continue on aspirin for antiplatelet therapy, rosuvastatin, and amlodipine.  No changes are made in his medical regimen today. 2. Treated with low-dose of rosuvastatin.  Excellent lipid panel with a cholesterol 107, LDL cholesterol of 47.  ALT is normal at 17. 3. Blood pressure is under ideal control with a combination of amlodipine and valsartan.  Recent labs are reviewed with a creatinine of 1.25 which is stable for him.  Potassium is normal at 4.0.  Overall the patient is doing very well from a cardiovascular perspective.  He reports no interval changes from his visit 1 year ago.  He demonstrates very good exercise capacity on stress testing.  A letter will be written for his FAA requirements.  I will see him back in 1 year unless problems arise in the interim.    Shared Decision Making/Informed Consent      Medication Adjustments/Labs and Tests Ordered: Current medicines are reviewed at length with the patient today.  Concerns regarding medicines are outlined above.  Orders Placed This Encounter  Procedures  . EKG 12-Lead   No orders of the defined types were placed in this encounter.   Patient Instructions  Medication Instructions:  Your provider recommends that you continue on  your current medications as directed. Please refer to the  Current Medication list given to you today.   *If you need a refill on your cardiac medications before your next appointment, please call your pharmacy*   Follow-Up: At [facility], you and your health needs are our priority.  As part of our continuing mission to provide you with exceptional heart care, we have created designated Provider Care Teams.  These Care Teams include your primary Cardiologist (physician) and Advanced Practice Providers (APPs -  Physician Assistants and Nurse Practitioners) who all work together to provide you with the care you need, when you need it. Your next appointment:   12 month(s) The format for your next appointment:   In Person Provider:   You may see Sherren Mocha, MD or one of the following Advanced Practice Providers on your designated Care Team:    Richardson Dopp, PA-C  Robbie Lis, [place]      Signed, Sherren Mocha, MD  09/11/2020 1:57 PM    [place]

## 2020-09-10 NOTE — Patient Instructions (Signed)
Medication Instructions:  Your provider recommends that you continue on your current medications as directed. Please refer to the Current Medication list given to you today.   *If you need a refill on your cardiac medications before your next appointment, please call your pharmacy*  Follow-Up: At [facility], you and your health needs are our priority.  As part of our continuing mission to provide you with exceptional heart care, we have created designated Provider Care Teams.  These Care Teams include your primary Cardiologist (physician) and Advanced Practice Providers (APPs -  Physician Assistants and Nurse Practitioners) who all work together to provide you with the care you need, when you need it. Your next appointment:   12 month(s) The format for your next appointment:   In Person Provider:   You may see Izaah Cooper, MD or one of the following Advanced Practice Providers on your designated Care Team:    Scott Weaver, PA-C  Vin Bhagat, PA-C   

## 2020-09-11 ENCOUNTER — Encounter: Payer: Self-pay | Admitting: Cardiovascular Disease

## 2020-11-12 ENCOUNTER — Other Ambulatory Visit: Payer: Self-pay | Admitting: Cardiovascular Disease

## 2020-11-12 DIAGNOSIS — I2510 Atherosclerotic heart disease of native coronary artery without angina pectoris: Secondary | ICD-10-CM

## 2020-11-13 NOTE — Telephone Encounter (Signed)
The patient needs a letter listing all his meds (including med purpose, name, dose, frequency taken, and presence or absence of side effects).  The patient will come to the office Thursday afternoon (1/13) to get a signed copy. He was grateful for assistance.

## 2020-11-28 ENCOUNTER — Telehealth: Payer: Self-pay | Admitting: Family Medicine

## 2020-11-28 NOTE — Telephone Encounter (Signed)
Patient declined AWV stated its not necessary

## 2020-12-17 ENCOUNTER — Telehealth (INDEPENDENT_AMBULATORY_CARE_PROVIDER_SITE_OTHER): Payer: Medicare Other | Admitting: Family Medicine

## 2020-12-17 ENCOUNTER — Encounter: Payer: Self-pay | Admitting: Family Medicine

## 2020-12-17 DIAGNOSIS — I10 Essential (primary) hypertension: Secondary | ICD-10-CM

## 2020-12-17 DIAGNOSIS — E782 Mixed hyperlipidemia: Secondary | ICD-10-CM

## 2020-12-17 DIAGNOSIS — I2510 Atherosclerotic heart disease of native coronary artery without angina pectoris: Secondary | ICD-10-CM

## 2020-12-17 NOTE — Progress Notes (Signed)
I connected with  Russell [name] on 12/17/20 by a video enabled telemedicine application and verified that I am speaking with the correct person using two identifiers.   I discussed the limitations of evaluation and management by telemedicine. The patient expressed understanding and agreed to proceed.

## 2020-12-17 NOTE — Progress Notes (Signed)
   Virtual Visit via Video   I connected with patient on 12/17/20 at  1:00 PM EST by a video enabled telemedicine application and verified that I am speaking with the correct person using two identifiers.  Location patient: Home Location provider: [facility], Office Persons participating in the virtual visit: Patient, Provider, [place] (Sabrina M)  I discussed the limitations of evaluation and management by telemedicine and the availability of in person appointments. The patient expressed understanding and agreed to proceed.  Subjective:   HPI:   HTN- chronic problem.  On Amlodipine 10mg  daily and Valsartan 320mg  daily.  No CP, SOB, HAs, visual changes, edema.  Cr 1.2 and K+ 3.8 on labs done in November w/ Dr Burt Knack.  Hyperlipidemia- chronic problem, on Crestor 5mg  daily.  No abd pain, N/V.  Total cholesterol 106, LDL 54.  HDL 36.  Overweight- BMI at Cardiology was 25.23  Pt reports no regular exercise.  Is active around the house.  ROS:   See pertinent positives and negatives per HPI.  Patient Active Problem List   Diagnosis Date Noted  . Prostate cancer ([place]) 02/28/2018  . Vitreous detachment of both eyes   . Chronic renal insufficiency, stage II (mild)   . Shingles 01/24/2015  . Herpes zoster 12/04/2014  . BPH (benign prostatic hypertrophy) 09/19/2014  . Enlarged prostate 03/30/2012  . CAD, ARTERY BYPASS GRAFT 06/15/2009  . HYPERLIPIDEMIA 08/30/2007  . Essential hypertension 08/30/2007  . 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 DERMATOPHYTOSIS, NAIL 06/15/2007  . HEMORRHOIDS 06/15/2007  . GERD 06/15/2007  . PEPTIC ULCER DISEASE 06/15/2007  . Adenomatous colon polyp 11/04/2003  . CAD (coronary artery disease) 11/03/2001    Social History   Tobacco Use  . Smoking status: Never Smoker  . Smokeless tobacco: Never Used  Substance Use Topics  . Alcohol use: No    Current Outpatient Medications:  .  amLODipine (NORVASC) 10 MG tablet, Take 1  tablet (10 mg total) by mouth daily., Disp: 90 tablet, Rfl: 3 .  aspirin 81 MG tablet, Take 81 mg by mouth daily., Disp: , Rfl:  .  OVER THE COUNTER MEDICATION, 1 tablet daily., Disp: , Rfl:  .  rosuvastatin (CRESTOR) 5 MG tablet, TAKE ONE TABLET BY MOUTH DAILY FOR CHOLESTEROL, Disp: 90 tablet, Rfl: 3 .  valsartan (DIOVAN) 320 MG tablet, Take 1 tablet (320 mg total) by mouth daily., Disp: 90 tablet, Rfl: 3  Allergies  Allergen Reactions  . Penicillins     hives    Objective:   There were no vitals taken for this visit. AAOx3, NAD NCAT, EOMI No obvious CN deficits Coloring WNL Pt is able to speak clearly, coherently without shortness of breath or increased work of breathing.  Thought process is linear.  Mood is appropriate.   Assessment and Plan:   HTN- chronic problem.  On Amlodipine 10mg  daily and Valsartan 320mg  daily w/ adequate control.  Had appt w/ Cardiology in November and flight physical w/ [employer].  Currently asymptomatic.  Reviewed labs done in November.  No changes at this time.  Hyperlipidemia- chronic problem, on Crestor 5mg  daily.  Most recent lipids in November showed excellent control.  No need to repeat today.  Overweight- BMI is just over 25.  Pt doesn't participate in formal exercise but stays busy around the house and yard.  Will continue to follow.   Annye Asa, MD 12/17/2020

## 2021-03-25 DIAGNOSIS — I10 Essential (primary) hypertension: Secondary | ICD-10-CM

## 2021-03-26 MED ORDER — HYDROCHLOROTHIAZIDE 25 MG PO TABS: 25.0000 mg | ORAL_TABLET | Freq: Every day | ORAL | 3 refills | Status: DC

## 2021-04-22 ENCOUNTER — Other Ambulatory Visit: Payer: Self-pay

## 2021-04-22 ENCOUNTER — Other Ambulatory Visit: Payer: Medicare Other

## 2021-04-22 DIAGNOSIS — I10 Essential (primary) hypertension: Secondary | ICD-10-CM

## 2021-04-23 ENCOUNTER — Telehealth: Payer: Self-pay | Admitting: *Deleted

## 2021-04-23 DIAGNOSIS — I25810 Atherosclerosis of coronary artery bypass graft(s) without angina pectoris: Secondary | ICD-10-CM

## 2021-04-23 LAB — BASIC METABOLIC PANEL
BUN/Creatinine Ratio: 16 (ref 10–24)
BUN: 22 mg/dL (ref 8–27)
CO2: 29 mmol/L (ref 20–29)
Calcium: 9.2 mg/dL (ref 8.6–10.2)
Chloride: 100 mmol/L (ref 96–106)
Creatinine, Ser: 1.37 mg/dL — ABNORMAL HIGH (ref 0.76–1.27)
Glucose: 111 mg/dL — ABNORMAL HIGH (ref 65–99)
Potassium: 3.2 mmol/L — ABNORMAL LOW (ref 3.5–5.2)
Sodium: 143 mmol/L (ref 134–144)
eGFR: 52 mL/min/{1.73_m2} — ABNORMAL LOW (ref >59)

## 2021-04-23 MED ORDER — POTASSIUM CHLORIDE ER 10 MEQ PO TBCR: 10.0000 meq | EXTENDED_RELEASE_TABLET | Freq: Every day | ORAL | 3 refills | Status: DC

## 2021-04-23 MED ORDER — HYDROCHLOROTHIAZIDE 12.5 MG PO CAPS: 12.5000 mg | ORAL_CAPSULE | Freq: Every day | ORAL | 3 refills | Status: DC

## 2021-04-23 NOTE — Telephone Encounter (Signed)
-----   Message from Sherren Mocha, MD sent at 04/23/2021  2:19 PM EDT ----- See result note - plan push fluids, reduce HCTZ to 12.5 mg daily, add KDur 10 meq daily.

## 2021-04-23 NOTE — Telephone Encounter (Signed)
Message from Dr. Cyndee Brightly, MD  04/23/2021  2:05 PM EDT      Potassium low, probably from HCTZ. Recommend supplement with KDur 10 meq daily, push fluids, and decrease HCTZ to 12.5 mg daily. Repeat metabolic panel in onemonth. thanks    Pt has been notified.  Will decrease hctz to 12.5 mg daily and begin KDur 10 meq daily and will increase fluid intake.  Repeat BMET scheduled. 05/24/21.

## 2021-05-01 ENCOUNTER — Encounter: Payer: Self-pay | Admitting: *Deleted

## 2021-05-16 ENCOUNTER — Telehealth: Payer: Self-pay | Admitting: Family Medicine

## 2021-05-16 NOTE — Telephone Encounter (Signed)
Spoke with patient he declined AWV and do not want any calls to schedule it

## 2021-05-24 ENCOUNTER — Other Ambulatory Visit: Payer: Medicare Other | Admitting: *Deleted

## 2021-05-24 ENCOUNTER — Other Ambulatory Visit: Payer: Self-pay

## 2021-05-24 DIAGNOSIS — I25810 Atherosclerosis of coronary artery bypass graft(s) without angina pectoris: Secondary | ICD-10-CM

## 2021-05-24 LAB — BASIC METABOLIC PANEL
BUN/Creatinine Ratio: 14 (ref 10–24)
BUN: 17 mg/dL (ref 8–27)
CO2: 26 mmol/L (ref 20–29)
Calcium: 9.1 mg/dL (ref 8.6–10.2)
Chloride: 103 mmol/L (ref 96–106)
Creatinine, Ser: 1.2 mg/dL (ref 0.76–1.27)
Glucose: 126 mg/dL — ABNORMAL HIGH (ref 65–99)
Potassium: 3.4 mmol/L — ABNORMAL LOW (ref 3.5–5.2)
Sodium: 145 mmol/L — ABNORMAL HIGH (ref 134–144)
eGFR: 62 mL/min/{1.73_m2} (ref >59)

## 2021-05-27 ENCOUNTER — Telehealth: Payer: Self-pay

## 2021-05-27 DIAGNOSIS — Z79899 Other long term (current) drug therapy: Secondary | ICD-10-CM

## 2021-05-27 DIAGNOSIS — E876 Hypokalemia: Secondary | ICD-10-CM

## 2021-05-27 MED ORDER — POTASSIUM CHLORIDE CRYS ER 20 MEQ PO TBCR: 20.0000 meq | EXTENDED_RELEASE_TABLET | Freq: Every day | ORAL | 3 refills | Status: DC

## 2021-05-27 NOTE — Telephone Encounter (Signed)
Pt advised that his K is still a little low to increase his Kdur to 20 meq daily and to have a repeat BMET 06/27/21.

## 2021-06-26 ENCOUNTER — Other Ambulatory Visit: Payer: Self-pay

## 2021-06-26 ENCOUNTER — Other Ambulatory Visit: Payer: Medicare Other | Admitting: *Deleted

## 2021-06-26 DIAGNOSIS — Z79899 Other long term (current) drug therapy: Secondary | ICD-10-CM

## 2021-06-26 DIAGNOSIS — E876 Hypokalemia: Secondary | ICD-10-CM

## 2021-06-27 ENCOUNTER — Other Ambulatory Visit: Payer: Medicare Other

## 2021-06-27 LAB — BASIC METABOLIC PANEL
BUN/Creatinine Ratio: 19 (ref 10–24)
BUN: 23 mg/dL (ref 8–27)
CO2: 27 mmol/L (ref 20–29)
Calcium: 9.4 mg/dL (ref 8.6–10.2)
Chloride: 103 mmol/L (ref 96–106)
Creatinine, Ser: 1.2 mg/dL (ref 0.76–1.27)
Glucose: 88 mg/dL (ref 65–99)
Potassium: 3.5 mmol/L (ref 3.5–5.2)
Sodium: 144 mmol/L (ref 134–144)
eGFR: 62 mL/min/{1.73_m2} (ref >59)

## 2021-08-15 ENCOUNTER — Telehealth (INDEPENDENT_AMBULATORY_CARE_PROVIDER_SITE_OTHER): Payer: Medicare Other | Admitting: Family Medicine

## 2021-08-15 ENCOUNTER — Other Ambulatory Visit: Payer: Self-pay

## 2021-08-15 ENCOUNTER — Encounter: Payer: Self-pay | Admitting: Family Medicine

## 2021-08-15 DIAGNOSIS — R051 Acute cough: Secondary | ICD-10-CM | POA: Diagnosis not present

## 2021-08-15 DIAGNOSIS — I25810 Atherosclerosis of coronary artery bypass graft(s) without angina pectoris: Secondary | ICD-10-CM | POA: Diagnosis not present

## 2021-08-15 DIAGNOSIS — U071 COVID-19: Secondary | ICD-10-CM

## 2021-08-15 MED ORDER — NIRMATRELVIR/RITONAVIR (PAXLOVID)TABLET: 3.0000 | ORAL_TABLET | Freq: Two times a day (BID) | ORAL | 0 refills | Status: AC

## 2021-08-15 MED ORDER — BENZONATATE 100 MG PO CAPS: 100.0000 mg | ORAL_CAPSULE | Freq: Three times a day (TID) | ORAL | 0 refills | Status: DC | PRN

## 2021-08-15 NOTE — Patient Instructions (Addendum)
Start paxlovid today.  Stop crestor for next 6 days.  Decrease amlodpine to 1/2 pill while taking paxlovid - Keep a record of your blood pressures outside of the office and adjust to full dose if blood pressures elevated.  Tessalon perles if needed for cough. Ok to continue mucinex DM, fluids, rest. Hope you continue to improve.  Urgent care eval or go to the nearest emergency room if any of your symptoms worsen or new symptoms occur.  Everyone who has presumed or confirmed COVID-19 should stay home and isolate from other people for at least 5 full days (day 0 is the first day of symptoms or the date of the day of the positive viral test for asymptomatic persons). You can end isolation after 5 full days if you are fever-free for 24 hours without the use of fever-reducing medication and your other symptoms have improved (Loss of taste and smell may persist for weeks or months after recovery and need not delay the end of isolation). You should continue to wear a well-fitting mask around others at home and in public for 5 additional days (day 6 through day 10) after the end of your 5-day isolation period. If you are unable to wear a mask when around others, you should continue to isolate for a full 10 days. Avoid people who have weakened immune systems or are more likely to get very sick from COVID-19, and nursing homes and other high-risk settings, until after at least 10 days.  If you continue to have fever or your other symptoms have not improved after 5 days of isolation, you should wait to end your isolation until you are fever-free for 24 hours without the use of fever-reducing medication and your other symptoms have improved. Continue to wear a well-fitting mask through day 10.   [email or web address].[email or web address]

## 2021-08-15 NOTE — Progress Notes (Signed)
Virtual Visit via Video Note  I connected with Russell [name] on 08/15/21 at 12:11 PM by a video enabled telemedicine application and verified that I am speaking with the correct person using two identifiers.  Patient location:home and by self.  My location: office - [place].    I discussed the limitations, risks, security and privacy concerns of performing an evaluation and management service by telephone and the availability of in person appointments. I also discussed with the patient that there may be a patient responsible charge related to this service. The patient expressed understanding and agreed to proceed, consent obtained  Chief complaint: Chief Complaint  Patient presents with   Covid Positive    Pt reports home covid positive yesterday, pt reports Monday afternoon sxs started    History of Present Illness: Russell [name] is a 80 y.o. adult  Covid 19 infection: Initial symptoms 3 days ago, 08/12/2021. Headache, body aches, head congestion. Initial negative test. Positive test yesterday at home. Fever initially, better today, less HA today.  drinking sufficient fluids, no confusion/disorientation. No chest pain. No shortness of breath. O2 sat 97% today at home today.  Tx: mucinex DM, anacin few times.   [id number] risk of complications score of 5.  With CAD, hypertension, age. COVID-19 vaccine, Moderna in February, March 2021, booster November 2021, and again in July.  He is on amlodipine 10 mg daily for hypertension, Crestor for hyperlipidemia/CAD, last dose last night. eGFR 62 on 06/26/2021.   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   CAD (coronary artery disease) 2003   CABG 2003.  ETT POSITIVE 09/2018-->10/04/2018 myocard perf imaging showed NO ISCHEMIA, EF 55-65%   Chronic renal insufficiency, stage II (mild)    CrCl 60s   Chronic retention of urine    secondary to severe prostate enlargement and prostate ca--manaed by prostate cryoablation in 04/2018   Elevated PSA    Prostate bx x 2 (BPH only).  As of 08/2017, PSA continues to rise +abnl prostate MRI, and Dr. 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  Bx done: +prostate cancer cT1c, intermediate risk categ, desires cryoablation as of 02/28/18 urol f/u.   Gout summer 2018   left elbow   Herpes zoster 12/2014   R side of chin (V3 area)   Hyperlipidemia    Hypertension    Prostate cancer ([place])    Cryoablation 04/2018: doing fine as of urol f/u 06/2018-->plan for annual PSA surveillance by urol   Right inguinal hernia    Repaired 2019   Ulcer 1981   Vitreous detachment of both eyes    + cataract   Past  Surgical History:  Procedure Laterality Date   BASAL CELL CARCINOMA EXCISION  04/2011   Mohs , Dr Harvel Quale   BIOPSY PROSTATE  approx 2010   Dr. Amalia Hailey.   CARDIOVASCULAR STRESS TEST  08/2014; 08/2015   2015 Low risk: no ischemia.  Inferior/inferolateral scar and slight decreased mobility of LV wall in this region, EF normal.  2016 ETT low risk.   CARDIOVASCULAR STRESS TEST  10/06/2016; 09/14/18   EF 45-54%, old MI, low risk study--no  change compared to 2015.  09/2018 POSITIVE ETT--cards f/u --> 10/04/2018 Myocard perf imaging showed NO ISCHEMIA, EF 55-65%.   COLONOSCOPY     12/2011 'tics, int hem, o/w NORMAL.  Repeat 02/2017 showed no polyps, +internal hem and sigmoid diverticulosis.. (negative 2008, polyp 2005; Dr Fuller Plan). No repeat recommended due to age.   CORONARY ARTERY BYPASS GRAFT  2003   5 vessel (Dr. Cyndia Bent)   [place]   ganglion cystectomy      hand   HEMORRHOID SURGERY     Dr. Harlow Asa   INGUINAL HERNIA REPAIR     [place].  Right (mesh)   LUMBAR LAMINECTOMY     Dr. Quincy Carnes   POLYPECTOMY     UMBILICAL HERNIA REPAIR  2018   Allergies  Allergen Reactions   Penicillins     hives   Prior to Admission medications   Medication Sig Start Date End Date Taking? Authorizing Provider  amLODipine (NORVASC) 10 MG tablet Take 1 tablet (10 mg total) by mouth daily. 11/13/20  Yes Sherren Mocha, MD  aspirin 81 MG tablet Take 81 mg by mouth daily.   Yes [provider]  hydrochlorothiazide (MICROZIDE) 12.5 MG capsule Take 1 capsule (12.5 mg total) by mouth daily. 04/23/21  Yes Sherren Mocha, MD  OVER THE COUNTER MEDICATION 1 tablet daily.   Yes [provider]  potassium chloride SA (KLOR-CON) 20 MEQ tablet Take 1 tablet (20 mEq total) by mouth daily. 05/27/21  Yes Sherren Mocha, MD  rosuvastatin (CRESTOR) 5 MG tablet TAKE ONE TABLET BY MOUTH DAILY FOR CHOLESTEROL 11/13/20  Yes Sherren Mocha, MD  valsartan (DIOVAN) 320 MG tablet Take 1 tablet (320 mg total) by mouth daily. 11/13/20  Yes Sherren Mocha, MD   Social History   Socioeconomic History   Marital status: Married    Spouse name: Not on file   Number of children: Not on file   Years of education: Not on file   Highest education level: Not on file  Occupational History   Not on file  Tobacco Use   Smoking status: Never   Smokeless tobacco: Never  Substance and Sexual Activity   Alcohol use: No   Drug use: No    Sexual activity: Not on file  Other Topics Concern   Not on file  Social History Narrative   Married, 2 sons.   Orig from [place].   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   No T/A/Ds.      Social Determinants of Health   Financial Resource Strain: Not on file  Food Insecurity: Not on file  Transportation Needs: Not on file  Physical Activity: Not on file  Stress: Not on file  Social Connections: Not on file  Intimate Partner Violence: Not on file    Observations/Objective: There were no vitals filed for this visit. Nontoxic appearance on video. Speaking in full sentences, no respiratory distress.coherent responses. All questions answered  Assessment and Plan: COVID-19  virus infection - Plan: benzonatate (TESSALON) 100 MG capsule, nirmatrelvir/ritonavir EUA (PAXLOVID) 20 x 150 MG & 10 x 100MG TABS  Acute cough - Plan: benzonatate (TESSALON) 100 MG capsule, nirmatrelvir/ritonavir EUA (PAXLOVID) 20 x 150 MG & 10 x 100MG TABS  No red flags on video exam/history. Continued symptomatic care with mucinex, fluids, antipyretics, rest. Tessalon rx if needed for cough.  R/b/a of antivirals discussed as well as potential side effects, risk of rebound covid and treatment if that were to occur - chose antiviral.  Start full dose paxlovid, hold statin, decrease CCB to 1/2 with ambulatory BP monitoring and increase to full dose if elevated BP. Isolation/mask precautions discussed and ER/Urgent care precautions given.    Follow Up Instructions: As needed - precautions above.    I discussed the assessment and treatment plan with the patient. The patient was provided an opportunity to ask questions and all were answered. The patient agreed with the plan and demonstrated an understanding of the instructions.   The patient was advised to call back or seek an in-person evaluation if the symptoms worsen or if the condition fails to improve as  anticipated.   Wendie Agreste, MD

## 2021-09-30 ENCOUNTER — Other Ambulatory Visit: Payer: Self-pay

## 2021-09-30 ENCOUNTER — Encounter: Payer: Self-pay | Admitting: Cardiovascular Disease

## 2021-09-30 ENCOUNTER — Ambulatory Visit (INDEPENDENT_AMBULATORY_CARE_PROVIDER_SITE_OTHER): Payer: Medicare Other | Admitting: Cardiovascular Disease

## 2021-09-30 VITALS — BP 130/66 | HR 57 | Ht 72.0 in | Wt 183.2 lb

## 2021-09-30 DIAGNOSIS — E782 Mixed hyperlipidemia: Secondary | ICD-10-CM

## 2021-09-30 DIAGNOSIS — I25810 Atherosclerosis of coronary artery bypass graft(s) without angina pectoris: Secondary | ICD-10-CM

## 2021-09-30 DIAGNOSIS — I10 Essential (primary) hypertension: Secondary | ICD-10-CM | POA: Diagnosis not present

## 2021-09-30 LAB — CBC
Hematocrit: 43.5 % (ref 37.5–51.0)
Hemoglobin: 15.1 g/dL (ref 13.0–17.7)
MCH: 30.9 pg (ref 26.6–33.0)
MCHC: 34.7 g/dL (ref 31.5–35.7)
MCV: 89 fL (ref 79–97)
Platelets: 199 10*3/uL (ref 150–450)
RBC: 4.88 x10E6/uL (ref 4.14–5.80)
RDW: 12.9 % (ref 11.6–15.4)
WBC: 7.4 10*3/uL (ref 3.4–10.8)

## 2021-09-30 LAB — COMPREHENSIVE METABOLIC PANEL
ALT: 26 IU/L (ref 0–44)
AST: 21 IU/L (ref 0–40)
Albumin/Globulin Ratio: 2 (ref 1.2–2.2)
Albumin: 4.5 g/dL (ref 3.7–4.7)
Alkaline Phosphatase: 65 IU/L (ref 44–121)
BUN/Creatinine Ratio: 13 (ref 10–24)
BUN: 16 mg/dL (ref 8–27)
Bilirubin Total: 0.6 mg/dL (ref 0.0–1.2)
CO2: 29 mmol/L (ref 20–29)
Calcium: 9.5 mg/dL (ref 8.6–10.2)
Chloride: 102 mmol/L (ref 96–106)
Creatinine, Ser: 1.24 mg/dL (ref 0.76–1.27)
Globulin, Total: 2.2 g/dL (ref 1.5–4.5)
Glucose: 97 mg/dL (ref 70–99)
Potassium: 3.8 mmol/L (ref 3.5–5.2)
Sodium: 144 mmol/L (ref 134–144)
Total Protein: 6.7 g/dL (ref 6.0–8.5)
eGFR: 59 mL/min/{1.73_m2} — ABNORMAL LOW (ref >59)

## 2021-09-30 LAB — LIPID PANEL
Chol/HDL Ratio: 3.2 ratio (ref 0.0–5.0)
Cholesterol, Total: 107 mg/dL (ref 100–199)
HDL: 33 mg/dL — ABNORMAL LOW (ref >39)
LDL Chol Calc (NIH): 51 mg/dL (ref 0–99)
Triglycerides: 126 mg/dL (ref 0–149)
VLDL Cholesterol Cal: 23 mg/dL (ref 5–40)

## 2021-09-30 NOTE — Patient Instructions (Signed)
Medication Instructions:  No changes *If you need a refill on your cardiac medications before your next appointment, please call your pharmacy*   Lab Work: Today: lipids, cmet, cbc  If you have labs (blood work) drawn today and your tests are completely normal, you will receive your results only by: [place] (if you have MyChart) OR A paper copy in the mail If you have any lab test that is abnormal or we need to change your treatment, we will call you to review the results.   Testing/Procedures: EXERCISE TOLERANCE TEST:  THIS IS PLANNED FOR NEXT YEAR IN (November) - PRIOR TO NEXT APPOINTMENT WITH DR. Burt Knack.    Your physician has requested that you have an exercise tolerance test. For further information please visit [email or web address]. Please also follow instruction sheet, as given.   Follow-Up: At [facility], you and your health needs are our priority.  As part of our continuing mission to provide you with exceptional heart care, we have created designated Provider Care Teams.  These Care Teams include your primary Cardiologist (physician) and Advanced Practice Providers (APPs -  Physician Assistants and Nurse Practitioners) who all work together to provide you with the care you need, when you need it.  Your next appointment:   12 month(s)  The format for your next appointment:   In Person  Provider:   Sherren Mocha, MD     Other Instructions

## 2021-09-30 NOTE — Progress Notes (Signed)
Cardiology Office Note:    Date:  09/30/2021   ID:  Russell [name], DOB 10/12/1941, MRN [id number]  PCP:  Midge Minium, MD   [facility] Providers Cardiologist:  Sherren Mocha, MD     Referring MD: Midge Minium, MD   Chief Complaint  Patient presents with   Coronary Artery Disease     History of Present Illness:    Russell [name] is a 80 y.o. adult with a hx of coronary artery disease, presenting for his annual cardiology follow-up evaluation.  The patient underwent multivessel CABG in 2003 with a LIMA to LAD graft, saphenous vein graft to diagonal, saphenous vein graft to circumflex obtuse marginal, and sequential saphenous vein graft to the second and third obtuse marginal branches.  The patient has undergone annual stress testing in order to comply with FAA requirements.  He has fully retired over the past year and does not require routine surveillance stress testing any longer.  The patient is doing well without any specific cardiac-related symptoms. Today, he denies symptoms of palpitations, chest pain, shortness of breath, orthopnea, PND, lower extremity edema, dizziness, or syncope. He is not engaged in regular exercise, but he is physically active with yard work and other activities, no limitations noted.  His wife has developed some dementia and he is spending most of his time taking care of her.  Past Medical History:  Diagnosis Date   Adenomatous colon polyp 2005   Blood transfusion 2003   BPH (benign prostatic hypertrophy)    Hx of post-op acute urinary retention 2019.   CAD (coronary artery disease) 2003   CABG 2003.  ETT POSITIVE 09/2018-->10/04/2018 myocard perf imaging showed NO ISCHEMIA, EF 55-65%   Chronic renal insufficiency, stage II (mild)    CrCl 60s   Chronic retention of urine    secondary to severe prostate enlargement and prostate ca--manaed by prostate cryoablation in 04/2018   Elevated PSA    Prostate bx x 2 (BPH only).  As of  08/2017, PSA continues to rise +abnl prostate MRI, and Dr. 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  Bx done: +prostate cancer cT1c, intermediate risk categ, desires cryoablation as of 02/28/18 urol f/u.   Gout summer 2018   left elbow   Herpes zoster 12/2014   R side of chin (V3 area)   Hyperlipidemia    Hypertension    Prostate cancer ([place])    Cryoablation 04/2018: doing fine as of urol f/u 06/2018-->plan for annual PSA surveillance by urol   Right inguinal hernia    Repaired 2019   Ulcer 1981   Vitreous detachment of both eyes    + cataract    Past Surgical History:  Procedure Laterality Date   BASAL CELL CARCINOMA EXCISION  04/2011   Mohs , Dr Harvel Quale   BIOPSY PROSTATE  approx 2010   Dr. Amalia Hailey.   CARDIOVASCULAR STRESS TEST  08/2014; 08/2015   2015 Low risk: no ischemia.  Inferior/inferolateral scar and slight decreased mobility of LV wall in this region, EF normal.  2016 ETT low risk.   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  09/2018 POSITIVE ETT--cards f/u --> 10/04/2018 Myocard perf imaging showed NO ISCHEMIA, EF 55-65%.   COLONOSCOPY     12/2011 'tics, int hem, o/w NORMAL.  Repeat 02/2017 showed no polyps, +internal hem and sigmoid diverticulosis.. (negative 2008, polyp  2005; Dr Fuller Plan). No repeat recommended due to age.   CORONARY ARTERY BYPASS GRAFT  2003   5 vessel (Dr. Cyndia Bent)   [place]   ganglion cystectomy      hand   HEMORRHOID SURGERY     Dr. Harlow Asa   INGUINAL HERNIA REPAIR     Bilat.  Right (mesh)   LUMBAR LAMINECTOMY     Dr. Quincy Carnes   POLYPECTOMY     UMBILICAL HERNIA REPAIR  2018    Current Medications: Current Meds  Medication Sig   amLODipine (NORVASC) 10 MG tablet Take 1 tablet (10 mg total) by mouth daily.   aspirin 81 MG tablet Take 81 mg by mouth daily.   hydrochlorothiazide  (MICROZIDE) 12.5 MG capsule Take 1 capsule (12.5 mg total) by mouth daily.   OVER THE COUNTER MEDICATION 1 tablet daily.   potassium chloride SA (KLOR-CON) 20 MEQ tablet Take 1 tablet (20 mEq total) by mouth daily.   rosuvastatin (CRESTOR) 5 MG tablet TAKE ONE TABLET BY MOUTH DAILY FOR CHOLESTEROL   valsartan (DIOVAN) 320 MG tablet Take 1 tablet (320 mg total) by mouth daily.     Allergies:   Penicillins   Social History   Socioeconomic History   Marital status: Married    Spouse name: Not on file   Number of children: Not on file   Years of education: Not on file   Highest education level: Not on file  Occupational History   Not on file  Tobacco Use   Smoking status: Never   Smokeless tobacco: Never  Substance and Sexual Activity   Alcohol use: No   Drug use: No   Sexual activity: Not on file  Other Topics Concern   Not on file  Social History Narrative   Married, 2 sons.   Orig from [place].   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   No T/A/Ds.      Social Determinants of Health   Financial Resource Strain: Not on file  Food Insecurity: Not on file  Transportation Needs: Not on file  Physical Activity: Not on file  Stress: Not on file  Social Connections: Not on file     Family History: The patient's family history includes Aneurysm (age of onset: 48) in his mother; Heart attack (age of onset: 53) in his father; Heart attack (age of onset: 19) in his brother. There is no history of Colon cancer, Esophageal cancer, Rectal cancer, or Stomach cancer.  ROS:   Please see the history of present illness.    All other systems reviewed and are negative.  EKGs/Labs/Other Studies Reviewed:    The following studies were reviewed today: Myoview Scan 09/07/2020: Nuclear stress EF: 63%. Blood pressure demonstrated a normal response to exercise. There was no ST segment deviation noted during stress. Defect 1: There is a small defect of  moderate severity present in the basal inferior location. The study is normal. This is a low risk study.   Normal stress nuclear study with mild inferobasal thinning but no ischemia.  Gated ejection fraction 63% with normal wall motion.  EKG:  EKG is ordered today.  The ekg ordered today demonstrates sinus bradycardia 57 bpm, nonspecific ST abnormality.  No significant change from prior tracings.  Recent Labs: 09/30/2021: ALT 26; BUN 16; Creatinine, Ser 1.24; Hemoglobin 15.1; Platelets 199; Potassium WILL FOLLOW; Sodium WILL FOLLOW  Recent Lipid Panel    Component Value Date/Time  CHOL 107 09/30/2021 1011   TRIG 126 09/30/2021 1011   HDL 33 (L) 09/30/2021 1011   CHOLHDL 3.2 09/30/2021 1011   CHOLHDL 3 06/21/2020 1305   VLDL 28.8 06/21/2020 1305   LDLCALC 51 09/30/2021 1011     Risk Assessment/Calculations:           Physical Exam:    VS:  BP 130/66   Pulse (!) 57   Ht 6' (1.829 m)   Wt 183 lb 3.2 oz (83.1 kg)   SpO2 95%   BMI 24.85 kg/m     Wt Readings from Last 3 Encounters:  09/30/21 183 lb 3.2 oz (83.1 kg)  09/10/20 186 lb (84.4 kg)  09/07/20 185 lb (83.9 kg)     GEN:  Well nourished, well developed in no acute distress HEENT: Normal NECK: No JVD; No carotid bruits LYMPHATICS: No lymphadenopathy CARDIAC: RRR, no murmurs, rubs, gallops RESPIRATORY:  Clear to auscultation without rales, wheezing or rhonchi  ABDOMEN: Soft, non-tender, non-distended MUSCULOSKELETAL:  No edema; No deformity  SKIN: Warm and dry NEUROLOGIC:  Alert and oriented x 3 PSYCHIATRIC:  Normal affect   ASSESSMENT:    1. Coronary artery disease involving coronary bypass graft of native heart without angina pectoris   2. Mixed hyperlipidemia   3. Essential hypertension    PLAN:    In order of problems listed above:  Clinically stable with no symptoms of angina.  Last stress test from 1 year ago reviewed as above.  We will continue current medical therapy with aspirin and  rosuvastatin.  Check exercise treadmill study next year. Lipids have been at goal, last LDL 54 mg/dL.  We will update lipids and LFTs today.  Continue low-dose Crestor. Blood pressure well controlled.  Potassium at the lower limits of normal on last check (3.5 mg/dL).  Patient on hydrochlorothiazide.  We will repeat a metabolic panel today.     Medication Adjustments/Labs and Tests Ordered: Current medicines are reviewed at length with the patient today.  Concerns regarding medicines are outlined above.  Orders Placed This Encounter  Procedures   CBC   Comprehensive metabolic panel   Lipid panel   EKG 12-Lead    No orders of the defined types were placed in this encounter.   Patient Instructions  Medication Instructions:  No changes *If you need a refill on your cardiac medications before your next appointment, please call your pharmacy*   Lab Work: Today: lipids, cmet, cbc  If you have labs (blood work) drawn today and your tests are completely normal, you will receive your results only by: [place] (if you have MyChart) OR A paper copy in the mail If you have any lab test that is abnormal or we need to change your treatment, we will call you to review the results.   Testing/Procedures: EXERCISE TOLERANCE TEST:  THIS IS PLANNED FOR NEXT YEAR IN (November) - PRIOR TO NEXT APPOINTMENT WITH DR. Burt Knack.    Your physician has requested that you have an exercise tolerance test. For further information please visit [email or web address]. Please also follow instruction sheet, as given.   Follow-Up: At [facility], you and your health needs are our priority.  As part of our continuing mission to provide you with exceptional heart care, we have created designated Provider Care Teams.  These Care Teams include your primary Cardiologist (physician) and Advanced Practice Providers (APPs -  Physician Assistants and Nurse Practitioners) who all work together to provide you with the  care you need,  when you need it.  Your next appointment:   12 month(s)  The format for your next appointment:   In Person  Provider:   Sherren Mocha, MD     Other Instructions      Signed, Sherren Mocha, MD  09/30/2021 5:51 PM    [place]

## 2021-11-11 ENCOUNTER — Other Ambulatory Visit: Payer: Self-pay | Admitting: Cardiovascular Disease

## 2021-11-11 DIAGNOSIS — I2510 Atherosclerotic heart disease of native coronary artery without angina pectoris: Secondary | ICD-10-CM

## 2022-01-28 ENCOUNTER — Encounter: Payer: Self-pay | Admitting: Gastroenterology

## 2022-01-28 ENCOUNTER — Ambulatory Visit (INDEPENDENT_AMBULATORY_CARE_PROVIDER_SITE_OTHER): Payer: Medicare Other | Admitting: Gastroenterology

## 2022-01-28 VITALS — BP 120/64 | HR 56 | Ht 72.0 in | Wt 186.0 lb

## 2022-01-28 DIAGNOSIS — K8020 Calculus of gallbladder without cholecystitis without obstruction: Secondary | ICD-10-CM | POA: Diagnosis not present

## 2022-01-28 DIAGNOSIS — I2510 Atherosclerotic heart disease of native coronary artery without angina pectoris: Secondary | ICD-10-CM

## 2022-01-28 DIAGNOSIS — Z86010 Personal history of colonic polyps: Secondary | ICD-10-CM

## 2022-01-28 NOTE — Patient Instructions (Signed)
No future recall colonoscopies.  ? ?The [facility] providers would like to encourage you to use [facility] to communicate with providers for non-urgent requests or questions.  Due to long hold times on the telephone, sending your provider a message by [facility] may be a faster and more efficient way to get a response.  Please allow 48 business hours for a response.  Please remember that this is for non-urgent requests.  ? ?Thank you for choosing me and [facility]. ? ?Malcolm T. Dagoberto Ligas., MD., [facility] ? ?

## 2022-01-28 NOTE — Progress Notes (Signed)
? ? ?History of Present Illness: This is an 81 year old male referred by Russell Minium, MD for the evaluation of a personal history of adenomatous colon polyps.  Two small adenomatous colon polyps were found on colonoscopy in February 2005.  Surveillance colonoscopy was in February 2013 and May 2018 were both without polyps.  Mild left colon diverticulosis and internal hemorrhoids were noted on both exams.  He has no ongoing gastrointestinal complaints. Denies weight loss, abdominal pain, constipation, diarrhea, change in stool caliber, melena, hematochezia, nausea, vomiting, dysphagia, reflux symptoms, chest pain. ? ? ? ?Allergies  ?Allergen Reactions  ? Penicillins   ?  hives  ? ?Outpatient Medications Prior to Visit  ?Medication Sig Dispense Refill  ? amLODipine (NORVASC) 10 MG tablet Take 1 tablet (10 mg total) by mouth daily. 90 tablet 3  ? aspirin 81 MG tablet Take 81 mg by mouth daily.    ? benzonatate (TESSALON) 100 MG capsule Take 1 capsule (100 mg total) by mouth 3 (three) times daily as needed for cough. 20 capsule 0  ? hydrochlorothiazide (MICROZIDE) 12.5 MG capsule Take 1 capsule (12.5 mg total) by mouth daily. 90 capsule 3  ? OVER THE COUNTER MEDICATION 1 tablet daily.    ? potassium chloride SA (KLOR-CON) 20 MEQ tablet Take 1 tablet (20 mEq total) by mouth daily. 90 tablet 3  ? rosuvastatin (CRESTOR) 5 MG tablet TAKE ONE TABLET BY MOUTH DAILY FOR CHOLESTEROL 90 tablet 3  ? valsartan (DIOVAN) 320 MG tablet Take 1 tablet (320 mg total) by mouth daily. 90 tablet 3  ? ?No facility-administered medications prior to visit.  ? ?Past Medical History:  ?Diagnosis Date  ? Adenomatous colon polyp 2005  ? Blood transfusion 2003  ? BPH (benign prostatic hypertrophy)   ? Hx of post-op acute urinary retention 2019.  ? CAD (coronary artery disease) 2003  ? CABG 2003.  ETT POSITIVE 09/2018-->10/04/2018 myocard perf imaging showed NO ISCHEMIA, EF 55-65%  ? Chronic renal insufficiency, stage II (mild)   ? CrCl 60s   ? Chronic retention of urine   ? secondary to severe prostate enlargement and prostate ca--manaed by prostate cryoablation in 04/2018  ? Elevated PSA   ? Prostate bx x 2 (BPH only).  As of 08/2017, PSA continues to rise +abnl prostate MRI, and Dr. 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  Bx done: +prostate cancer cT1c, intermediate risk categ, desires cryoablation as of 02/28/18 urol f/u.  ? Gout summer 2018  ? left elbow  ? Herpes zoster 12/2014  ? R side of chin (V3 area)  ? Hyperlipidemia   ? Hypertension   ? Prostate cancer ([place])   ? Cryoablation 04/2018: doing fine as of urol f/u 06/2018-->plan for annual PSA surveillance by urol  ? Right inguinal hernia   ? Repaired 2019  ? Ulcer 1981  ? Vitreous detachment of both eyes   ? + cataract  ? ?Past Surgical History:  ?Procedure Laterality Date  ? BASAL CELL CARCINOMA EXCISION  04/2011  ? Mohs , Dr Harvel Quale  ? BIOPSY PROSTATE  approx 2010  ? Dr. Amalia Hailey.  ? CARDIOVASCULAR STRESS TEST  08/2014; 08/2015  ? 2015 Low risk: no ischemia.  Inferior/inferolateral scar and slight decreased mobility of LV wall in this region, EF normal.  2016 ETT low risk.  ? CARDIOVASCULAR STRESS TEST  10/06/2016; 09/14/18  ? EF 45-54%, old MI, low risk study--no change compared to  2015.  09/2018 POSITIVE ETT--cards f/u --> 10/04/2018 Myocard perf imaging showed NO ISCHEMIA, EF 55-65%.  ? COLONOSCOPY    ? 12/2011 'tics, int hem, o/w NORMAL.  Repeat 02/2017 showed no polyps, +internal hem and sigmoid diverticulosis.. (negative 2008, polyp 2005; Dr Fuller Plan). No repeat recommended due to age.  ? CORONARY ARTERY BYPASS GRAFT  2003  ? 5 vessel (Dr. Cyndia Bent)  ? [place]  ? ganglion cystectomy     ? hand  ? HEMORRHOID SURGERY    ? Dr. Harlow Asa  ? INGUINAL HERNIA REPAIR    ? Bilat.  Right (mesh)  ? LUMBAR LAMINECTOMY    ? Dr. Quincy Carnes  ? POLYPECTOMY    ? UMBILICAL HERNIA REPAIR  2018  ? ?Social History   ? ?Socioeconomic History  ? Marital status: Married  ?  Spouse name: Not on file  ? Number of children: Not on file  ? Years of education: Not on file  ? Highest education level: Not on file  ?Occupational History  ? Not on file  ?Tobacco Use  ? Smoking status: Never  ? Smokeless tobacco: Never  ?Substance and Sexual Activity  ? Alcohol use: No  ? Drug use: No  ? Sexual activity: Not on file  ?Other Topics Concern  ? Not on file  ?Social History Narrative  ? Married, 2 sons.  ? Orig from [place].  ? 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  ? No T/A/Ds.  ?   ? ?Social Determinants of Health  ? ?Financial Resource Strain: Not on file  ?Food Insecurity: Not on file  ?Transportation Needs: Not on file  ?Physical Activity: Not on file  ?Stress: Not on file  ?Social Connections: Not on file  ? ?Family History  ?Problem Relation Age of Onset  ? Heart attack Father 73  ?     suicide post MI  ? Heart attack Brother 81  ? Aneurysm Mother 28  ?     cns; pacer  ? Colon cancer Neg Hx   ? Esophageal cancer Neg Hx   ? Rectal cancer Neg Hx   ? Stomach cancer Neg Hx   ? ?   ? ?Review of Systems: Pertinent positive and negative review of systems were noted in the above HPI section. All other review of systems were otherwise negative. ? ? ?Physical Exam: ?General: Well developed, well nourished, no acute distress ?Head: Normocephalic and atraumatic ?Eyes: Sclerae anicteric, EOMI ?Ears: Normal auditory acuity ?Mouth: Not examined, mask on during Covid-19 pandemic ?Neck: Supple, no masses or thyromegaly ?Lungs: Clear throughout to auscultation ?Heart: Regular rate and rhythm; no murmurs, rubs or bruits ?Abdomen: Soft, non tender and non distended. No masses, hepatosplenomegaly or hernias noted. Normal Bowel sounds ?Rectal: Not done  ?Musculoskeletal: Symmetrical with no gross deformities  ?Skin: No lesions on visible extremities ?Pulses:  Normal pulses noted ?Extremities: No clubbing, cyanosis, edema or  deformities noted ?Neurological: Alert oriented x 4, grossly nonfocal ?Cervical Nodes:  No significant cervical adenopathy ?Inguinal Nodes: No significant inguinal adenopathy ?Psychological:  Alert and cooperative. Normal mood and affect ? ? ?Assessment and Recommendations: ? ?Personal history of adenomatous colon polyps in 2005. Two recent colonoscopies were without polyps so his next colonoscopy would be at a 10 year interval when he will be 85. Routine surveillance colonoscopies will be deferred and he agrees with this recommendation.  GI follow up prn.  ?Cholelithiasis, asymptomatic.  GI follow up prn.  ? ? ?  cc: Russell Minium, MD ?[address] ?[place],  [place] [address] ?

## 2022-08-01 ENCOUNTER — Encounter: Payer: Self-pay | Admitting: *Deleted

## 2022-08-01 ENCOUNTER — Telehealth: Payer: Self-pay | Admitting: *Deleted

## 2022-08-01 NOTE — Patient Outreach (Signed)
  Care Coordination   Initial Visit Note   08/01/2022 Name: Russell [name] MRN: [id number] DOB: 27-Apr-1941  Russell [name] is a 81 y.o. year old adult who sees Tabori, Aundra Millet, MD for primary care. I spoke with  Russell [name] by phone today.  What matters to the patients health and wellness today?  No needs    Goals Addressed               This Visit's Progress     COMPLETED: No needs (pt-stated)        Care Coordination Interventions: Advised patient to Encouraged pt to scheduled a AWV (refused). Reviewed medications with patient and discussed adherence with no needed refills. Reviewed scheduled/upcoming provider appointments including all pending appointments. Assessed social determinant of health barriers         SDOH assessments and interventions completed:  Yes  SDOH Interventions Today    Flowsheet Row Most Recent Value  SDOH Interventions   Food Insecurity Interventions Intervention Not Indicated  Housing Interventions Intervention Not Indicated  Transportation Interventions Intervention Not Indicated  Utilities Interventions Intervention Not Indicated        Care Coordination Interventions Activated:  Yes  Care Coordination Interventions:  Yes, provided   Follow up plan: No further intervention required.   Encounter Outcome:  Pt. Visit Completed   Raina Mina, RN Care Management Coordinator [place] Office [phone]

## 2022-08-01 NOTE — Patient Instructions (Signed)
Visit Information  Thank you for taking time to visit with me today. Please don't hesitate to contact me if I can be of assistance to you.   Following are the goals we discussed today:   Goals Addressed               This Visit's Progress     COMPLETED: No needs (pt-stated)        Care Coordination Interventions: Advised patient to Encouraged pt to scheduled a AWV (refused). Reviewed medications with patient and discussed adherence with no needed refills. Reviewed scheduled/upcoming provider appointments including all pending appointments. Assessed social determinant of health barriers         Please call the care guide team at [phone] if you need to cancel or reschedule your appointment.   If you are experiencing a Mental Health or [place] or need someone to talk to, please call the Suicide and Crisis Lifeline: 988  Patient verbalizes understanding of instructions and care plan provided today and agrees to view in [place]. Active MyChart status and patient understanding of how to access instructions and care plan via MyChart confirmed with patient.     No further follow up required: No needs    Raina Mina, RN Care Management Coordinator [place] Office [phone]

## 2022-08-19 ENCOUNTER — Encounter: Payer: Self-pay | Admitting: Cardiovascular Disease

## 2022-08-19 DIAGNOSIS — I25810 Atherosclerosis of coronary artery bypass graft(s) without angina pectoris: Secondary | ICD-10-CM

## 2022-08-20 NOTE — Telephone Encounter (Signed)
Per OV note from 09/30/21:  The patient has undergone annual stress testing in order to comply with FAA requirements.  He has fully retired over the past year and does not require routine surveillance stress testing any longer.  The patient is doing well without any specific cardiac-related symptoms. Clinically stable with no symptoms of angina.  Last stress test from 1 year ago reviewed as above.  We will continue current medical therapy with aspirin and rosuvastatin.  Check exercise treadmill study next year.  Will route to Dr Burt Knack to clarify if he prefers nuclear exercise treadmill or POET.

## 2022-08-24 NOTE — Telephone Encounter (Signed)
POET  (non-imaging stress test). thx

## 2022-08-25 NOTE — Addendum Note (Signed)
Addended by: Ma Hillock on: 08/25/2022 05:35 PM   Modules accepted: Orders

## 2022-08-26 ENCOUNTER — Encounter: Payer: Self-pay | Admitting: Cardiovascular Disease

## 2022-08-26 ENCOUNTER — Other Ambulatory Visit: Payer: Self-pay | Admitting: Cardiovascular Disease

## 2022-08-27 ENCOUNTER — Telehealth: Payer: Self-pay

## 2022-08-27 DIAGNOSIS — E782 Mixed hyperlipidemia: Secondary | ICD-10-CM

## 2022-08-27 DIAGNOSIS — I25810 Atherosclerosis of coronary artery bypass graft(s) without angina pectoris: Secondary | ICD-10-CM

## 2022-08-27 NOTE — Telephone Encounter (Signed)
Called and spoke with patient who is requesting annual labs to be done prior to his visit with Dr Burt Knack on 09/03/22. Pt has stress test scheduled for 09/02/22. Will repeat same labs as last year's visit and pt will have drawn when he comes for stress test.

## 2022-09-01 NOTE — Addendum Note (Signed)
Addended by: Sherren Mocha on: 09/01/2022 08:15 AM   Modules accepted: Orders

## 2022-09-02 ENCOUNTER — Ambulatory Visit: Payer: Medicare Other | Attending: Cardiovascular Disease

## 2022-09-02 ENCOUNTER — Ambulatory Visit: Payer: Medicare Other

## 2022-09-02 DIAGNOSIS — E782 Mixed hyperlipidemia: Secondary | ICD-10-CM

## 2022-09-02 DIAGNOSIS — I25810 Atherosclerosis of coronary artery bypass graft(s) without angina pectoris: Secondary | ICD-10-CM | POA: Diagnosis not present

## 2022-09-02 LAB — LIPID PANEL
Chol/HDL Ratio: 2.9 ratio (ref 0.0–5.0)
Cholesterol, Total: 89 mg/dL — ABNORMAL LOW (ref 100–199)
HDL: 31 mg/dL — ABNORMAL LOW (ref >39)
LDL Chol Calc (NIH): 41 mg/dL (ref 0–99)
Triglycerides: 82 mg/dL (ref 0–149)
VLDL Cholesterol Cal: 17 mg/dL (ref 5–40)

## 2022-09-02 LAB — COMPREHENSIVE METABOLIC PANEL
ALT: 17 IU/L (ref 0–44)
AST: 19 IU/L (ref 0–40)
Albumin/Globulin Ratio: 2.3 — ABNORMAL HIGH (ref 1.2–2.2)
Albumin: 4.4 g/dL (ref 3.8–4.8)
Alkaline Phosphatase: 73 IU/L (ref 44–121)
BUN/Creatinine Ratio: 16 (ref 10–24)
BUN: 18 mg/dL (ref 8–27)
Bilirubin Total: 0.5 mg/dL (ref 0.0–1.2)
CO2: 29 mmol/L (ref 20–29)
Calcium: 8.8 mg/dL (ref 8.6–10.2)
Chloride: 106 mmol/L (ref 96–106)
Creatinine, Ser: 1.12 mg/dL (ref 0.76–1.27)
Globulin, Total: 1.9 g/dL (ref 1.5–4.5)
Glucose: 94 mg/dL (ref 70–99)
Potassium: 3.7 mmol/L (ref 3.5–5.2)
Sodium: 146 mmol/L — ABNORMAL HIGH (ref 134–144)
Total Protein: 6.3 g/dL (ref 6.0–8.5)
eGFR: 66 mL/min/{1.73_m2} (ref >59)

## 2022-09-02 LAB — EXERCISE TOLERANCE TEST
Angina Index: 0
Base ST Depression (mm): 0 mm
Duke Treadmill Score: 8
Estimated workload: 15
Exercise duration (min): 8 min
Exercise duration (sec): 0 s
MPHR: 140 {beats}/min
Peak HR: 144 {beats}/min
Percent HR: 102 %
RPE: 15
Rest HR: 67 {beats}/min
ST Depression (mm): 0 mm

## 2022-09-02 LAB — CBC
Hematocrit: 42.2 % (ref 37.5–51.0)
Hemoglobin: 14.4 g/dL (ref 13.0–17.7)
MCH: 30.8 pg (ref 26.6–33.0)
MCHC: 34.1 g/dL (ref 31.5–35.7)
MCV: 90 fL (ref 79–97)
Platelets: 183 10*3/uL (ref 150–450)
RBC: 4.67 x10E6/uL (ref 4.14–5.80)
RDW: 13.1 % (ref 11.6–15.4)
WBC: 5.7 10*3/uL (ref 3.4–10.8)

## 2022-09-03 ENCOUNTER — Ambulatory Visit: Payer: Medicare Other | Attending: Cardiovascular Disease | Admitting: Cardiovascular Disease

## 2022-09-03 ENCOUNTER — Encounter: Payer: Self-pay | Admitting: Cardiovascular Disease

## 2022-09-03 VITALS — BP 126/60 | HR 52 | Ht 72.0 in | Wt 180.0 lb

## 2022-09-03 DIAGNOSIS — I10 Essential (primary) hypertension: Secondary | ICD-10-CM | POA: Diagnosis not present

## 2022-09-03 DIAGNOSIS — E782 Mixed hyperlipidemia: Secondary | ICD-10-CM | POA: Diagnosis not present

## 2022-09-03 DIAGNOSIS — I25810 Atherosclerosis of coronary artery bypass graft(s) without angina pectoris: Secondary | ICD-10-CM | POA: Diagnosis not present

## 2022-09-03 NOTE — Progress Notes (Unsigned)
Cardiology Office Note:    Date:  09/04/2022   ID:  Russell [name], DOB September 06, 1941, MRN [id number]  PCP:  Russell Minium, MD   [place] [facility] Cardiologist:  Russell Mocha, MD     Referring MD: Russell Minium, MD   Chief Complaint  Patient presents with   Coronary Artery Disease    History of Present Illness:    Russell [name] is a 81 y.o. adult with a hx of coronary artery disease, presenting for his annual cardiology follow-up evaluation.  The patient underwent multivessel CABG in 2003 with a LIMA to LAD graft, saphenous vein graft to diagonal, saphenous vein graft to circumflex obtuse marginal, and sequential saphenous vein graft to the second and third obtuse marginal branches.  The patient has undergone annual stress testing in order to comply with FAA requirements.  The patient is here alone today.  He is occupied with caring for his wife who has developed progressive dementia.  The patient has been doing well and has no complaints today.  He specifically denies chest pain, chest pressure, or shortness of breath.  He has had no orthopnea, PND, edema, or heart palpitations.  He is compliant with his medications. He is fully retired and no longer requires [employer] documentation.  He did have an exercise stress treadmill recently with results discussed below.  Past Medical History:  Diagnosis Date   Adenomatous colon polyp 2005   Blood transfusion 2003   BPH (benign prostatic hypertrophy)    Hx of post-op acute urinary retention 2019.   CAD (coronary artery disease) 2003   CABG 2003.  ETT POSITIVE 09/2018-->10/04/2018 myocard perf imaging showed NO ISCHEMIA, EF 55-65%   Chronic renal insufficiency, stage II (mild)    CrCl 60s   Chronic retention of urine    secondary to severe prostate enlargement and prostate ca--manaed by prostate cryoablation in 04/2018   Elevated PSA    Prostate bx x 2 (BPH only).  As of 08/2017, PSA continues to rise +abnl prostate  MRI, and Dr. Amalia [name] recommended transperineal prostate bx---pt to defer bx until after his FAA clearance exam (as of 09/2017).  Bx done: +prostate cancer cT1c, intermediate risk categ, desires cryoablation as of 02/28/18 urol f/u.   Gout summer 2018   left elbow   Herpes zoster 12/2014   R side of chin (V3 area)   Hyperlipidemia    Hypertension    Prostate cancer ([place])    Cryoablation 04/2018: doing fine as of urol f/u 06/2018-->[name] for annual PSA surveillance by urol   Right inguinal hernia    Repaired 2019   Ulcer 1981   Vitreous detachment of both eyes    + cataract    Past Surgical History:  Procedure Laterality Date   BASAL CELL CARCINOMA EXCISION  04/2011   Mohs , Dr Russell [name]   BIOPSY PROSTATE  approx 2010   Dr. Amalia [name].   CARDIOVASCULAR STRESS TEST  08/2014; 08/2015   2015 Low risk: no ischemia.  Inferior/inferolateral scar and slight decreased mobility of LV wall in this region, EF normal.  2016 ETT low risk.   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  09/2018 POSITIVE ETT--cards f/u --> 10/04/2018 Myocard perf imaging showed NO ISCHEMIA, EF 55-65%.   COLONOSCOPY     12/2011 'tics, int hem, o/w NORMAL.  Repeat 02/2017 showed no polyps, +internal hem and sigmoid diverticulosis.. (negative 2008, polyp 2005; Dr Russell [name]).  No repeat recommended due to age.   CORONARY ARTERY BYPASS GRAFT  2003   5 vessel (Dr. Cyndia [name])   [place]   ganglion cystectomy      hand   HEMORRHOID SURGERY     Dr. Harlow [name]   INGUINAL HERNIA REPAIR     [place].  Right (mesh)   LUMBAR LAMINECTOMY     Dr. Quincy [name]   POLYPECTOMY     UMBILICAL HERNIA REPAIR  2018    Current Medications: Current Meds  Medication Sig   amLODipine (NORVASC) 10 MG tablet Take 1 tablet (10 mg total) by mouth daily.   aspirin 81 MG tablet Take 81 mg by mouth daily.   OVER THE COUNTER MEDICATION 1 tablet daily.   rosuvastatin (CRESTOR) 5  MG tablet TAKE ONE TABLET BY MOUTH DAILY FOR CHOLESTEROL   valsartan (DIOVAN) 320 MG tablet Take 1 tablet (320 mg total) by mouth daily.     Allergies:   Penicillins   Social History   Socioeconomic History   Marital status: Married    Spouse name: Not on file   Number of children: Not on file   Years of education: Not on file   Highest education level: Not on file  Occupational History   Not on file  Tobacco Use   Smoking status: Never   Smokeless tobacco: Never  Substance and Sexual Activity   Alcohol use: No   Drug use: No   Sexual activity: Not on file  Other Topics Concern   Not on file  Social History Narrative   Married, 2 sons.   Orig from [place].   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   No T/A/Ds.      Social Determinants of Health   Financial Resource Strain: Not on file  Food Insecurity: No Food Insecurity (08/01/2022)   Hunger Vital Sign    Worried About Running Out of Food in the Last Year: Never true    Ran Out of Food in the Last Year: Never true  Transportation Needs: No Transportation Needs (08/01/2022)   PRAPARE - Hydrologist (Medical): No    Lack of Transportation (Non-Medical): No  Physical Activity: Not on file  Stress: Not on file  Social Connections: Not on file     Family History: The patient's family history includes Aneurysm (age of onset: 72) in his mother; Heart attack (age of onset: 31) in his father; Heart attack (age of onset: 17) in his brother. There is no history of Colon cancer, Esophageal cancer, Rectal cancer, or Stomach cancer.  ROS:   Please see the history of present illness.    All other systems reviewed and are negative.  EKGs/Labs/Other Studies Reviewed:    The following studies were reviewed today: GXT:   A Bruce protocol stress test was performed. Exercise capacity was normal. Patient exercised for 8 min and 0 sec. Maximum HR of 144 bpm. MPHR 102.0 %.  Peak METS 15.0 . The patient experienced no angina during the test. Normal blood pressure and normal heart rate response noted during stress. Heart rate recovery was normal.   No ST deviation was noted. The ECG was negative for ischemia.   This is a low risk study.  EKG:  EKG is ordered today.  The ekg ordered today demonstrates sinus bradycardia 52 bpm, first degree AV block, nonspecific ST abnormality  Recent Labs: 09/02/2022: ALT 17; BUN 18; Creatinine,  Ser 1.12; Hemoglobin 14.4; Platelets 183; Potassium 3.7; Sodium 146  Recent Lipid Panel    Component Value Date/Time   CHOL 89 (L) 09/02/2022 0959   TRIG 82 09/02/2022 0959   HDL 31 (L) 09/02/2022 0959   CHOLHDL 2.9 09/02/2022 0959   CHOLHDL 3 06/21/2020 1305   VLDL 28.8 06/21/2020 1305   LDLCALC 41 09/02/2022 0959     Risk Assessment/Calculations:                Physical Exam:    VS:  BP 126/60   Pulse (!) 52   Ht 6' (1.829 m)   Wt 180 lb (81.6 kg)   SpO2 96%   BMI 24.41 kg/m     Wt Readings from Last 3 Encounters:  09/03/22 180 lb (81.6 kg)  01/28/22 186 lb (84.4 kg)  09/30/21 183 lb 3.2 oz (83.1 kg)     GEN:  Well nourished, well developed in no acute distress HEENT: Normal NECK: No JVD; No carotid bruits LYMPHATICS: No lymphadenopathy CARDIAC: RRR, no murmurs, rubs, gallops RESPIRATORY:  Clear to auscultation without rales, wheezing or rhonchi  ABDOMEN: Soft, non-tender, non-distended MUSCULOSKELETAL:  No edema; No deformity  SKIN: Warm and dry NEUROLOGIC:  Alert and oriented x 3 PSYCHIATRIC:  Normal affect   ASSESSMENT:    1. Coronary artery disease involving coronary bypass graft of native heart without angina pectoris   2. Essential hypertension   3. Mixed hyperlipidemia    [name]:    In order of problems listed above:  The patient is doing well and he is stable with no symptoms of angina.  He will continue on rosuvastatin, aspirin, and amlodipine.  No changes made today. Blood pressure is  well controlled on a combination of valsartan and amlodipine.  Continue the same.  Most recent labs reviewed. Lipids are excellent with LDL cholesterol less than 55.  Continue rosuvastatin at low-dose.    Medication Adjustments/Labs and Tests Ordered: Current medicines are reviewed at length with the patient today.  Concerns regarding medicines are outlined above.  Orders Placed This Encounter  Procedures   EKG 12-Lead   No orders of the defined types were placed in this encounter.   Patient Instructions  Medication Instructions:  Your physician recommends that you continue on your current medications as directed. Please refer to the Current Medication list given to you today.  *If you need a refill on your cardiac medications before your next appointment, please call your pharmacy*   Lab Work: NONE If you have labs (blood work) drawn today and your tests are completely normal, you will receive your results only by: [place] (if you have MyChart) OR A paper copy in the mail If you have any lab test that is abnormal or we need to change your treatment, we will call you to review the results.   Testing/Procedures: NONE   Follow-Up: At [facility], you and your health needs are our priority.  As part of our continuing mission to provide you with exceptional heart care, we have created designated Provider Care Teams.  These Care Teams include your primary Cardiologist (physician) and Advanced Practice Providers (APPs -  Physician Assistants and Nurse Practitioners) who all work together to provide you with the care you need, when you need it.  Your next appointment:   1 year(s)  The format for your next appointment:   In Person  Provider:   Sherren Mocha, MD  or APP     Important Information  About Sugar         Signed, Russell Mocha, MD  09/04/2022 2:29 PM    [place]

## 2022-09-03 NOTE — Patient Instructions (Signed)
Medication Instructions:  Your physician recommends that you continue on your current medications as directed. Please refer to the Current Medication list given to you today.  *If you need a refill on your cardiac medications before your next appointment, please call your pharmacy*   Lab Work: NONE If you have labs (blood work) drawn today and your tests are completely normal, you will receive your results only by: [place] (if you have MyChart) OR A paper copy in the mail If you have any lab test that is abnormal or we need to change your treatment, we will call you to review the results.   Testing/Procedures: NONE   Follow-Up: At [facility], you and your health needs are our priority.  As part of our continuing mission to provide you with exceptional heart care, we have created designated Provider Care Teams.  These Care Teams include your primary Cardiologist (physician) and Advanced Practice Providers (APPs -  Physician Assistants and Nurse Practitioners) who all work together to provide you with the care you need, when you need it.  Your next appointment:   1 year(s)  The format for your next appointment:   In Person  Provider:   Sherren Mocha, MD  or APP     Important Information About Sugar

## 2022-09-04 ENCOUNTER — Encounter: Payer: Self-pay | Admitting: Cardiovascular Disease

## 2023-02-03 ENCOUNTER — Other Ambulatory Visit: Payer: Self-pay | Admitting: Cardiovascular Disease

## 2023-02-03 DIAGNOSIS — I2510 Atherosclerotic heart disease of native coronary artery without angina pectoris: Secondary | ICD-10-CM

## 2023-08-04 ENCOUNTER — Other Ambulatory Visit: Payer: Self-pay | Admitting: Cardiovascular Disease

## 2023-08-17 ENCOUNTER — Other Ambulatory Visit: Payer: Self-pay | Admitting: Cardiovascular Disease

## 2023-08-17 DIAGNOSIS — I2510 Atherosclerotic heart disease of native coronary artery without angina pectoris: Secondary | ICD-10-CM

## 2023-09-04 ENCOUNTER — Encounter: Payer: Self-pay | Admitting: Cardiovascular Disease

## 2023-09-04 ENCOUNTER — Ambulatory Visit: Payer: Medicare Other | Attending: Cardiovascular Disease | Admitting: Cardiovascular Disease

## 2023-09-04 VITALS — BP 138/82 | HR 52 | Ht 72.0 in | Wt 184.8 lb

## 2023-09-04 DIAGNOSIS — I25810 Atherosclerosis of coronary artery bypass graft(s) without angina pectoris: Secondary | ICD-10-CM | POA: Diagnosis present

## 2023-09-04 DIAGNOSIS — E782 Mixed hyperlipidemia: Secondary | ICD-10-CM | POA: Diagnosis present

## 2023-09-04 DIAGNOSIS — I10 Essential (primary) hypertension: Secondary | ICD-10-CM | POA: Insufficient documentation

## 2023-09-04 NOTE — Assessment & Plan Note (Signed)
He remains stable on aspirin for antiplatelet therapy and a low-dose statin drug as tolerated.  No anginal symptoms.  Follow-up 1 year.

## 2023-09-04 NOTE — Assessment & Plan Note (Signed)
Blood pressure is controlled on amlodipine and valsartan.  Continue same management.  Check labs today.

## 2023-09-04 NOTE — Patient Instructions (Signed)
Lab Work: CBC, CMET, Lipids today If you have labs (blood work) drawn today and your tests are completely normal, you will receive your results only by: MyChart Message (if you have MyChart) OR A paper copy in the mail If you have any lab test that is abnormal or we need to change your treatment, we will call you to review the results.  Follow-Up: At [facility], you and your health needs are our priority.  As part of our continuing mission to provide you with exceptional heart care, we have created designated Provider Care Teams.  These Care Teams include your primary Cardiologist (physician) and Advanced Practice Providers (APPs -  Physician Assistants and Nurse Practitioners) who all work together to provide you with the care you need, when you need it.  Your next appointment:   1 year(s)  Provider:   Tonny Bollman, MD

## 2023-09-04 NOTE — Assessment & Plan Note (Signed)
Treated with rosuvastatin 5 mg daily.  Last lipids with an LDL cholesterol of 41.  Will update labs today.  Check lipids and LFTs.

## 2023-09-05 LAB — CBC
Hematocrit: 45.4 % (ref 37.5–51.0)
Hemoglobin: 14.8 g/dL (ref 13.0–17.7)
MCH: 30.2 pg (ref 26.6–33.0)
MCHC: 32.6 g/dL (ref 31.5–35.7)
MCV: 93 fL (ref 79–97)
Platelets: 205 10*3/uL (ref 150–450)
RBC: 4.9 x10E6/uL (ref 4.14–5.80)
RDW: 13.2 % (ref 11.6–15.4)
WBC: 6.6 10*3/uL (ref 3.4–10.8)

## 2023-09-05 LAB — COMPREHENSIVE METABOLIC PANEL
ALT: 18 [IU]/L (ref 0–44)
AST: 19 [IU]/L (ref 0–40)
Albumin: 4.5 g/dL (ref 3.7–4.7)
Alkaline Phosphatase: 70 [IU]/L (ref 44–121)
BUN/Creatinine Ratio: 18 (ref 10–24)
BUN: 20 mg/dL (ref 8–27)
Bilirubin Total: 0.5 mg/dL (ref 0.0–1.2)
CO2: 27 mmol/L (ref 20–29)
Calcium: 9 mg/dL (ref 8.6–10.2)
Chloride: 105 mmol/L (ref 96–106)
Creatinine, Ser: 1.11 mg/dL (ref 0.76–1.27)
Globulin, Total: 1.9 g/dL (ref 1.5–4.5)
Glucose: 96 mg/dL (ref 70–99)
Potassium: 3.9 mmol/L (ref 3.5–5.2)
Sodium: 143 mmol/L (ref 134–144)
Total Protein: 6.4 g/dL (ref 6.0–8.5)
eGFR: 67 mL/min/{1.73_m2} (ref >59)

## 2023-09-05 LAB — LIPID PANEL
Chol/HDL Ratio: 3 ratio (ref 0.0–5.0)
Cholesterol, Total: 108 mg/dL (ref 100–199)
HDL: 36 mg/dL — ABNORMAL LOW (ref >39)
LDL Chol Calc (NIH): 54 mg/dL (ref 0–99)
Triglycerides: 92 mg/dL (ref 0–149)
VLDL Cholesterol Cal: 18 mg/dL (ref 5–40)

## 2023-11-15 NOTE — Progress Notes (Signed)
 No chief complaint on file.   HPI:   Today's visit was completed via a real-time telehealth (see specific modality noted below). A telehealth visit was utilized in order to decrease the patient's potential exposure to COVID-19 vs an in-person visit. The patient/authorized person provided oral consent at the time of the visit to engaging in a telemedicine encounter with the present provider at [facility]. The patient/authorized person was informed of the potential benefits, limitations, and risks of telemedicine. The patient/authorized person expressed understanding that the laws that protect confidentiality also apply to telemedicine. The patient/authorized person acknowledged understanding that telemedicine does not provide emergency services and that he or she would need to call 911 or proceed to the nearest hospital for help if such a need arose.  Total time spent in the clinical discussion 25 minutes. Telehealth Modality: Phone visit (audio only)   [name] is an 82-YO gentleman who was managed by Dr. Louise and then Dr. Rosina who have both re-located. 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 He underwent cryotherapy of the prostate on 04/21/18 (likely targeted with a pre-procedure PSA of 13.3).  His most recent PSAs were 4.32 on 01/26/2020, 5.58 on 08/02/2020, 6.27 on 01/11/2021 (PSAD: 0.10), 6.47 on 05/27/2021, 7.57 on 08/27/2021 and 8.56 on 12/12/2021, 8.76 on 04/08/2022, 11.71 on 01/29/2023 and 10.05 on 05/05/2023.  He underwent a targeted transperineal needle biopsy on 10/06/2023 with the below pathology.  Activity on the PET scan and biopsies were positive on the left only.  The right appeared to be clear, and the prostate volume was approximately 65 cc.  He has been seen by Dr. Frizzell who discussed active surveillance and radiation options.  He felt that short-term ADT plus external beam therapy might be a  reasonable choice.  He is for further discussion.   MRI 05/08/21:   Prostatic volume (ellipsoid): 4.1 cm x 5.0 cm x 6.1 cm x 0.52 = 65  mL. IMPRESSION: Evolution of postablative changes with no obvious signs of new suspicious focal lesions.    PMSA-PET scan 12/26/2021:   1.  No convincing PSMA-avid masses to strongly suggest disease recurrence or metastatic disease. 2.  Mild PSMA avid inhomogeneity of the prostate without focality. Findings favored secondary to treatment changes. 3.  Two areas of very low uptake of the right anterolateral third rib and right lateral sixth rib without associated sclerotic changes on CT.  PMSA-PET scan 09/08/2023:  1.  PSMA uptake in the left posterior peripheral zone in the mid prostate concerning for residual/recurrent disease. 2.  No definite PSMA avid lymph nodes or soft tissue. 3.  Similar low PSMA uptake in the right third and sixth ribs which may be benign, however, for which continued attention on follow-up is recommended.  Pathology 10/06/2023:   D.  PROSTATE, LEFT POSTERIOR BASE, NEEDLE BIOPSY: Acinar adenocarcinoma, Grade Group 2 (Gleason Score 3+4=7). Percentage of Pattern 4:  approximately 30%. Number of cores involved:  1 of 2 cores. Percentage of tissue involved:  approximately 5%.   G.  PROSTATE, LEFT ANTERIOR APEX, NEEDLE BIOPSY: Acinar adenocarcinoma, Grade Group 2 (Gleason Score 3+4=7). Percentage of Pattern 4:  approximately 20%. Number of cores involved:  2 of 2 cores. Percentage of tissue involved:  approximately 40%.   H.  PROSTATE, LEFT POSTERIOR APEX, NEEDLE BIOPSY: Acinar adenocarcinoma, Grade Group 2 (Gleason Score 3+4=7). Percentage of Pattern 4:  approximately 20%. Number of cores involved:  1 of 2 cores. Percentage  of tissue involved:  approximately 40%.  Tumor marker pattern over time has been the following: No results found for: CA199, CEA, CHROMGRNA, AFPTM, CA125, CA2729, CA153, HCG  Past Medical  History:  Diagnosis Date  . Coronary artery disease   . Elevated PSA   . Hypertension     Allergies  Allergen Reactions  . Penicillins Anaphylaxis    hives     Current Outpatient Medications:  .  amLODIPine  (NORVASC ) 10 mg tablet, Take 10 mg by mouth Once Daily., Disp: , Rfl:  .  aspirin 81 mg chewable tablet, Take 81 mg by mouth Once Daily., Disp: , Rfl:  .  Crestor  5 mg tablet, Take 5 mg by mouth Once Daily., Disp: , Rfl:  .  valsartan  (DIOVAN ) 320 mg tablet, , Disp: , Rfl:   Family History  Problem Relation Name Age of Onset  . Heart disease Father    . Heart disease Mother      Social History   Socioeconomic History  . Marital status: Married    Spouse name: None  . Number of children: None  . Years of education: None  . Highest education level: None  Occupational History  . None  Tobacco Use  . Smoking status: Never  . Smokeless tobacco: Never  Substance and Sexual Activity  . Alcohol use: No  . Drug use: No  . Sexual activity: None  Other Topics Concern  . None  Social History Narrative  . None   Social Drivers of Health   Food Insecurity: No Food Insecurity (08/01/2022)   [facility]   Food vital sign   . Within the past 12 months, you worried that your food would run out before you got money to buy more: Never true   . Within the past 12 months, the food you bought just didn't last and you didn't have money to get more: Never true  Transportation Needs: No Transportation Needs (08/01/2022)   Received from [facility] [place]   [facility] - Transportation   . Lack of Transportation (Medical): No   . Lack of Transportation (Non-Medical): No  Safety: Not on file  Living Situation: Not on file    Patient Active Problem List   Diagnosis Date Noted  Date Diagnosed  . Malignant neoplasm of prostate (HCC) 08/02/2020   . Benign prostatic hyperplasia with urinary retention 03/31/2012   . Elevated PSA 03/30/2012     Resolved Problems    Diagnosis Date Noted Date Resolved Date Diagnosed  . Prostate cancer (CMD) 09/09/2023 11/15/2023     ROS:A comprehensive review of systems was negative.  EXAM: There were no vitals filed for this visit. GENERAL: Appears alert, oriented and in no acute distress on the telephone  Results for orders placed or performed during the hospital encounter of 10/06/23  Tissue Exam   Collection Time: 10/06/23  1:59 PM  Result Value Ref Range   Case Report      Surgical Pathology Report                         Case: [id number]                              Authorizing Provider:  Tanda Jama Nicholaus DOUGLAS, MD   Collected:           10/06/2023 1359  Ordering Location:     [facility]  Received:            10/06/2023 1506                                    [facility] - MAIN OPERATING                                                                           ROOM                                                                        Pathologist:           Kara Plane Kim-Shapiro, MD                                                    Specimens:   A) - Prostate, Right Anterior Base of Prostate - Or 24 - PF/RH                                     B) - Prostate, Right Posterior Base of Prostate - Or 24 - PF/RH                                    C) - Prostate, Left Anterior Base of Prostate - Or 24 - PF/RH                                      D) - Prostate, Left Posterior Base of Prostate - Or 24 - PF/RH                                      E) - Prostate, Right Anterior Apex of Prostate - Or 24 - PF/RH                                     F) - Prostate, Right Posterior Apex of Prostate - Or 24 - PF/RH                                    G) - Prostate, Left Anterior Apex of Prostate - Or 24 - PF/RH  H) - Prostate, Left Posterior Apex of Prostate - Or 24 - PF/RH                                     I) - Prostate, Left Posterior MID Prostate (ROI)  - Or 24 - PF/RH                         Final Diagnosis       A.  PROSTATE, RIGHT ANTERIOR BASE, NEEDLE BIOPSY: Fibromuscular stroma.  B.  PROSTATE, RIGHT POSTERIOR BASE, NEEDLE BIOPSY: Benign prostatic tissue.  C.  PROSTATE, LEFT ANTERIOR BASE, NEEDLE BIOPSY: Benign prostatic tissue with chronic and acute inflammation.  D.  PROSTATE, LEFT POSTERIOR BASE, NEEDLE BIOPSY: Acinar adenocarcinoma, Grade Group 2 (Gleason Score 3+4=7). Percentage of Pattern 4:  approximately 30%. Number of cores involved:  1 of 2 cores. Percentage of tissue involved:  approximately 5%. See comment.  E.  PROSTATE, RIGHT ANTERIOR APEX, NEEDLE BIOPSY: Benign prostatic tissue.  F.  PROSTATE, RIGHT POSTERIOR APEX, NEEDLE BIOPSY: Benign prostatic tissue with chronic and acute inflammation.  G.  PROSTATE, LEFT ANTERIOR APEX, NEEDLE BIOPSY: Acinar adenocarcinoma, Grade Group 2 (Gleason Score 3+4=7). Percentage of Pattern 4:  approximately 20%. Number of cores involved:  2 of 2 cores. Percentage of tissue involved:  approximately 40%. See comment.  H.  PROSTATE, LEFT POSTERIOR APEX, NEEDLE BIOPSY: Acinar adenocarcinoma, Grade Group 2 (Gleason Score 3+4=7). Percentage of Pattern 4:  approximately 20%. Number of cores involved:  1 of 2 cores. Percentage of tissue involved:  approximately 40%. See comment.  I. PROSTATE, LEFT POSTERIOR MID (ROI), NEEDLE BIOPSY: Benign prostatic tissue with mild chronic inflammation.     Comment      Immunohistochemistry for PIN4 on the left posterior base (specimen D), left anterior apex (specimen G) and left posterior apex (specimen H) show foci with absent basal cell layer with increased cytoplasmic racemase staining, consistent with adenocarcinoma.  Immunohistochemical positive controls stained appropriately.    Gross Description      A. Received fresh and subsequently placed in formalin, labeled with the patient's name, medical record number, and right anterior base of prostate are  2 tan core biopsies, 0.5 cm and 1.7 cm, entirely submitted between sponges in A1. B. Received fresh and subsequently placed in formalin, labeled with the patient's name, medical record number, and right posterior base of prostate are 2 tan core biopsies, 1.8 cm and 2.2 cm, entirely submitted between sponges in B1. C. Received fresh and subsequently placed in formalin, labeled with the patient's name, medical record number, and left anterior base of prostate are 2 tan core biopsies, 1.5 cm and 1.6 cm, entirely submitted between sponges in C1. D. Received fresh and subsequently placed in formalin, labeled with the patient's name, medical record number, and left posterior base of prostate are 2 tan core biopsies, 1.0 cm and 1.9 cm, entirely submitted between sponges in D1. E. Received fresh and subsequently placed in formalin, labeled with the patient's name, medical record number, and right anterior apex of prostate are 2 tan core biopsies, 1.6 cm and 2.1 cm, entirely submitted between sponges in E1. F. Received fresh and subsequently placed in formalin, labeled with the patient's name, medical record number, and right posterior apex of prostate are 2 tan core biopsies, 1.5 cm and 1.6 cm, entirely submitted between sponges in F1. G. Received fresh and  subsequently placed in formalin, labeled with the patient's name, medical record number, and left anterior apex of prostate are 2 tan core biopsies, 1.7 cm and 1.8 cm, entirely submitted between sponges in G1. H. Received fresh and subsequently placed in formalin, labeled with the patient's name, medical record number, and left posterior apex of prostate are 2 tan core biopsies, 1.2 cm and 1.6 cm, entirely submitted between sponges in H1. I. Received fresh and subsequently placed in formalin, labeled with the patient's name, medical record number, and left posterior mid prostate (ROI) are 4 tan core biopsies, 0.6 cm to 1.9 cm, entirely  submitted between sponges in I1.  Gross completed by [facility] 10/06/2023 3:23 PM.  All specimens are formalin-processed and paraffin-embedded unless otherwise noted.    Clinical Information      Prostate cancer (CMD) [C61].  Per electronic record, history of prostate cancer s/p cryoablation; multiparametric transrectal ultrasound transperineal prostate biopsy; findings: zonal anatomy changes consistent with prior cryoablation; prostate dimensions 4.1 cm x 5.0 cm x 6.1 cm x 0.52 = 65ml; no areas appeared to have increase vascularity.    Disclaimer      These tests were developed and their performance characteristics determined by [facility], [facility].  They have not been cleared or approved by the U.S. Food and Drug Administration.  The FDA has determined that such clearance or approval is not necessary.  These tests are used for clinical purposes.  They should not be regarded as investigational or for research.  This laboratory is certified under the Clinical Laboratory Improvement Amendments of 1988 (CLIA) as qualified to perform high complexity clinical laboratory testing.    Point of Service      [facility] [place], [place] [id number], [facility] [place], [place] [address]     Assessment:   Patient Active Problem List  Diagnosis  . Benign prostatic hyperplasia with urinary retention  . Elevated PSA  . Malignant neoplasm of prostate (HCC)    Plan:  Diagnoses and all orders for this visit:  Malignant neoplasm of prostate [facility]) -     Case request operating room: CRYOABLATION PROSTATE -     Ambulatory referral to Anesthesiology; Future  Elevated PSA   We discussed his situation in detail.  He is interested in a left cryosurgical Hemi ablation of the prostate and fully understands risk, potential benefits and alternatives.  We will set that up and proceed accordingly.              Return if symptoms worsen  or fail to improve.    Electronically signed by: Tanda LITTIE Nicholaus DOUGLAS, MD 11/16/2023 12:41 PM

## 2023-11-16 ENCOUNTER — Other Ambulatory Visit: Payer: Self-pay | Admitting: Cardiovascular Disease

## 2023-11-16 DIAGNOSIS — I2510 Atherosclerotic heart disease of native coronary artery without angina pectoris: Secondary | ICD-10-CM

## 2023-11-23 ENCOUNTER — Encounter: Payer: Self-pay | Admitting: Family Medicine

## 2023-11-23 NOTE — Telephone Encounter (Signed)
Pt has been scheduled for appt 11/25/2023 Per Dr.Tabori patients husband was advised if sx worsen to take pt to ER

## 2024-01-28 ENCOUNTER — Other Ambulatory Visit: Payer: Self-pay | Admitting: Cardiovascular Disease

## 2024-01-28 ENCOUNTER — Encounter: Payer: Self-pay | Admitting: Cardiovascular Disease

## 2024-04-15 ENCOUNTER — Encounter: Payer: Self-pay | Admitting: Family Medicine

## 2024-04-15 ENCOUNTER — Telehealth (INDEPENDENT_AMBULATORY_CARE_PROVIDER_SITE_OTHER): Admitting: Family Medicine

## 2024-04-15 VITALS — Ht 72.0 in

## 2024-04-15 DIAGNOSIS — F4323 Adjustment disorder with mixed anxiety and depressed mood: Secondary | ICD-10-CM | POA: Diagnosis not present

## 2024-04-15 NOTE — Progress Notes (Unsigned)
   Virtual Visit via Video   I connected with patient on 04/15/24 at 10:20 AM EDT by a video enabled telemedicine application and verified that I am speaking with the correct person using two identifiers.  Location patient: Home Location provider: [place], Office Persons participating in the virtual visit: Patient, Provider, CMA Jannifer Mention K)  I discussed the limitations of evaluation and management by telemedicine and the availability of in person appointments. The patient expressed understanding and agreed to proceed.  Subjective:   HPI:   Adjustment d/o- wife has recently transitioned to Hospice.  2 weeks ago she was mumbling, very weak.  Husband had to call EMS who recommended Hospice rather than hospital visit.  Pt felt he lost control of her care since she went to Hospice.  ROS:   See pertinent positives and negatives per HPI.  Patient Active Problem List   Diagnosis Date Noted   Prostate cancer (HCC) 02/28/2018   Vitreous detachment of both eyes    Chronic renal insufficiency, stage II (mild)    Shingles 01/24/2015   Herpes zoster 12/04/2014   Benign prostatic hyperplasia 09/19/2014   Enlarged prostate 03/30/2012   CAD, ARTERY BYPASS GRAFT 06/15/2009   HYPERLIPIDEMIA 08/30/2007   Essential hypertension 08/30/2007   HYPERPLASIA, PRST NOS W/O URINARY OBST/LUTS 08/30/2007   DERMATOPHYTOSIS, NAIL 06/15/2007   Hemorrhoids 06/15/2007   GERD 06/15/2007   Peptic ulcer 06/15/2007   Adenomatous colon polyp 11/04/2003   CAD (coronary artery disease) 11/03/2001    Social History   Tobacco Use   Smoking status: Never   Smokeless tobacco: Never  Substance Use Topics   Alcohol use: No    Current Outpatient Medications:    amLODipine  (NORVASC ) 10 MG tablet, Take 1 tablet (10 mg total) by mouth daily., Disp: 90 tablet, Rfl: 2   aspirin 81 MG tablet, Take 81 mg by mouth daily., Disp: , Rfl:    rosuvastatin  (CRESTOR ) 5 MG tablet, TAKE ONE TABLET BY MOUTH DAILY FOR  CHOLESTEROL, Disp: 90 tablet, Rfl: 1   valsartan  (DIOVAN ) 320 MG tablet, Take 1 tablet (320 mg total) by mouth daily., Disp: 90 tablet, Rfl: 1   multivitamin-lutein (OCUVITE-LUTEIN) CAPS capsule, Take 1 capsule by mouth daily., Disp: , Rfl:   Allergies  Allergen Reactions   Penicillins     hives    Objective:   Ht 6' (1.829 m)   BMI 25.06 kg/m   Patient is well-developed, well-nourished in no acute distress.  Resting comfortably *** at home.  Head is normocephalic, atraumatic.  No labored breathing.  Speech is clear and coherent with logical content.  Patient is alert and oriented at baseline.  ***  Assessment and Plan:        ***.   Laymon Priest, MD 04/15/2024  Time spent with the patient: *** minutes, of which >50% was spent in obtaining information about symptoms, reviewing previous labs, evaluations, and treatments, counseling about condition (please see the discussed topics above), and developing a plan to further investigate it; had a number of questions which I addressed.

## 2024-06-23 ENCOUNTER — Telehealth: Payer: Self-pay

## 2024-06-23 NOTE — Telephone Encounter (Signed)
   Pre-operative Risk Assessment    Patient Name: Russell [name]  DOB: 12-10-1940 MRN: [id number]   Date of last office visit: 09/04/23 Date of next office visit: N/A   Request for Surgical Clearance    Procedure:  Laparoscopic inguinal surgery   Date of Surgery:  Clearance TBD                                 Surgeon: Deward Null III, MD  Surgeon's Group or Practice Name:  [facility]  Phone number:  [phone] Fax number:  [phone]   Type of Clearance Requested:   - Medical  - Pharmacy:  Hold Aspirin Not indicated    Type of Anesthesia:  General    Additional requests/questions:    Bonney Rebeca Blight   06/23/2024, 3:57 PM

## 2024-06-24 ENCOUNTER — Telehealth: Payer: Self-pay | Admitting: *Deleted

## 2024-06-24 NOTE — Telephone Encounter (Signed)
Pt calling back to schedule appt

## 2024-06-24 NOTE — Telephone Encounter (Signed)
 Pt has been scheduled tele preop appt 06/30/24. Med rec and consent are done.

## 2024-06-24 NOTE — Telephone Encounter (Signed)
 Pt has been scheduled tele preop appt 06/30/24. Med rec and consent are done.     Patient Consent for Virtual Visit        SHONTE SODERLUND has provided verbal consent on 06/24/2024 for a virtual visit (video or telephone).   CONSENT FOR VIRTUAL VISIT FOR:  Ozell JINNY Moose  By participating in this virtual visit I agree to the following:  I hereby voluntarily request, consent and authorize [facility] and its employed or contracted physicians, physician assistants, nurse practitioners or other licensed health care professionals (the Practitioner), to provide me with telemedicine health care services (the "Services) as deemed necessary by the treating Practitioner. I acknowledge and consent to receive the Services by the Practitioner via telemedicine. I understand that the telemedicine visit will involve communicating with the Practitioner through live audiovisual communication technology and the disclosure of certain medical information by electronic transmission. I acknowledge that I have been given the opportunity to request an in-person assessment or other available alternative prior to the telemedicine visit and am voluntarily participating in the telemedicine visit.  I understand that I have the right to withhold or withdraw my consent to the use of telemedicine in the course of my care at any time, without affecting my right to future care or treatment, and that the Practitioner or I may terminate the telemedicine visit at any time. I understand that I have the right to inspect all information obtained and/or recorded in the course of the telemedicine visit and may receive copies of available information for a reasonable fee.  I understand that some of the potential risks of receiving the Services via telemedicine include:  Delay or interruption in medical evaluation due to technological equipment failure or disruption; Information transmitted may not be sufficient (e.g. poor  resolution of images) to allow for appropriate medical decision making by the Practitioner; and/or  In rare instances, security protocols could fail, causing a breach of personal health information.  Furthermore, I acknowledge that it is my responsibility to provide information about my medical history, conditions and care that is complete and accurate to the best of my ability. I acknowledge that Practitioner's advice, recommendations, and/or decision may be based on factors not within their control, such as incomplete or inaccurate data provided by me or distortions of diagnostic images or specimens that may result from electronic transmissions. I understand that the practice of medicine is not an exact science and that Practitioner makes no warranties or guarantees regarding treatment outcomes. I acknowledge that a copy of this consent can be made available to me via my patient portal [facility] MyChart), or I can request a printed copy by calling the office of [facility].    I understand that my insurance will be billed for this visit.   I have read or had this consent read to me. I understand the contents of this consent, which adequately explains the benefits and risks of the Services being provided via telemedicine.  I have been provided ample opportunity to ask questions regarding this consent and the Services and have had my questions answered to my satisfaction. I give my informed consent for the services to be provided through the use of telemedicine in my medical care

## 2024-06-24 NOTE — Telephone Encounter (Signed)
 Called patient to try to set up an telephone visit for a pre-op no answer left a vm

## 2024-06-24 NOTE — Telephone Encounter (Signed)
   Name: Russell [name]  DOB: 07/30/1941  MRN: [id number]  Primary Cardiologist: Ozell Fell, MD   Preoperative team, please contact this patient and set up a phone call appointment for further preoperative risk assessment. Please obtain consent and complete medication review. Thank you for your help. Last seen by Dr.Cooper on 09/04/2023.  Has upcoming appt in December 2025, but would likely need to be cleared before this date of surgery.   I confirm that guidance regarding antiplatelet and oral anticoagulation therapy has been completed and, if necessary, noted below.  Per office protocol, if patient is without any new symptoms or concerns at the time of their virtual visit, he/she may hold aspirin for 7 days prior to procedure. Please resume aspirin as soon as possible postprocedure, at the discretion of the surgeon.    I also confirmed the patient resides in the state of [place] . As per [facility] [employer] telemedicine laws, the patient must reside in the state in which the provider is licensed.   Lamarr Satterfield, NP 06/24/2024, 8:19 AM [facility]

## 2024-06-29 ENCOUNTER — Ambulatory Visit: Payer: Self-pay | Admitting: General Surgery

## 2024-06-30 ENCOUNTER — Ambulatory Visit: Attending: Internal Medicine

## 2024-06-30 DIAGNOSIS — Z01810 Encounter for preprocedural cardiovascular examination: Secondary | ICD-10-CM | POA: Insufficient documentation

## 2024-06-30 NOTE — Progress Notes (Signed)
 Virtual Visit via Telephone Note   Because of Russell [name] co-morbid illnesses, he is at least at moderate risk for complications without adequate follow up.  This format is felt to be most appropriate for this patient at this time.  Due to technical limitations with video connection (technology), today's appointment will be conducted as an audio only telehealth visit, and Russell [name] verbally agreed to proceed in this manner.   All issues noted in this document were discussed and addressed.  No physical exam could be performed with this format.  Evaluation Performed:  Preoperative cardiovascular risk assessment _____________   Date:  06/30/2024   Patient ID:  Russell [name], Russell [name] 1941/08/13, MRN [id number] Patient Location:  Home Provider location:   Office  Primary Care Provider:  No primary care provider on file. Primary Cardiologist:  Russell Fell, MD  Chief Complaint / Patient Profile   83 y.o. y/o male with a h/o CAD s/p CABG 2003, HTN, HLD who is pending laparoscopic inguinal hernia repair and presents today for telephonic preoperative cardiovascular risk assessment.  History of Present Illness    Russell [name] is a 83 y.o. male who presents via audio/video conferencing for a telehealth visit today.  Pt was last seen in cardiology clinic on 09/04/2023 by Dr. Fell.  At that time Russell [name] was doing well and stable with no complaints of chest pain and controlled BP. The patient is now pending procedure as outlined above. Since his last visit, he has been doing well with no new cardiac complaints.  He is staying active and completing all of his home task due to his wife's diagnosis of dementia.  He denies chest pain, shortness of breath, lower extremity edema, fatigue, palpitations, melena, hematuria, hemoptysis, diaphoresis, weakness, presyncope, syncope, orthopnea, and PND.    Past Medical History    Past Medical History:  Diagnosis Date   Adenomatous  colon polyp 2005   Blood transfusion 2003   BPH (benign prostatic hypertrophy)    Hx of post-op acute urinary retention 2019.   CAD (coronary artery disease) 2003   CABG 2003.  ETT POSITIVE 09/2018-->10/04/2018 myocard perf imaging showed NO ISCHEMIA, EF 55-65%   Chronic renal insufficiency, stage II (mild)    CrCl 60s   Chronic retention of urine    secondary to severe prostate enlargement and prostate ca--manaed by prostate cryoablation in 04/2018   Elevated PSA    Prostate bx x 2 (BPH only).  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  Bx done: +prostate cancer cT1c, intermediate risk categ, desires cryoablation as of 02/28/18 urol f/u.   Gout summer 2018   left elbow   Herpes zoster 12/2014   R side of chin (V3 area)   Hyperlipidemia    Hypertension    Prostate cancer (HCC)    Cryoablation 04/2018: doing fine as of urol f/u 06/2018-->plan for annual PSA surveillance by urol   Right inguinal hernia    Repaired 2019   Ulcer 1981   Vitreous detachment of both eyes    + cataract   Past Surgical History:  Procedure Laterality Date   BASAL CELL CARCINOMA EXCISION  04/2011   Mohs , Dr [name]   BIOPSY PROSTATE  approx 2010   Russell [name].   CARDIOVASCULAR STRESS TEST  08/2014; 08/2015   2015 Low risk: no ischemia.  Inferior/inferolateral  scar and slight decreased mobility of LV wall in this region, EF normal.  2016 ETT low risk.   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  09/2018 POSITIVE ETT--cards f/u --> 10/04/2018 Myocard perf imaging showed NO ISCHEMIA, EF 55-65%.   COLONOSCOPY     12/2011 'tics, int hem, o/w NORMAL.  Repeat 02/2017 showed no polyps, +internal hem and sigmoid diverticulosis.. (negative 2008, polyp 2005; Dr [name]). No repeat recommended due to age.   CORONARY ARTERY  BYPASS GRAFT  2003   5 vessel (Russell [name])   FLEXIBLE SIGMOIDOSCOPY     1992, 1997   ganglion cystectomy      hand   HEMORRHOID SURGERY     Russell [name]   INGUINAL HERNIA REPAIR     Bilat.  Right (mesh)   LUMBAR LAMINECTOMY     Russell [name]   POLYPECTOMY     UMBILICAL HERNIA REPAIR  2018    Allergies  Allergies  Allergen Reactions   Penicillins     hives    Home Medications    Prior to Admission medications   Medication Sig Start Date End Date Taking? Authorizing Provider  amLODipine  (NORVASC ) 10 MG tablet Take 1 tablet (10 mg total) by mouth daily. 11/17/23   Wonda Sharper, MD  aspirin 81 MG tablet Take 81 mg by mouth daily.    [provider]  multivitamin-lutein (OCUVITE-LUTEIN) CAPS capsule Take 1 capsule by mouth daily. Patient not taking: Reported on 06/24/2024    [provider]  rosuvastatin  (CRESTOR ) 5 MG tablet TAKE ONE TABLET BY MOUTH DAILY FOR CHOLESTEROL 01/28/24   Wonda Sharper, MD  valsartan  (DIOVAN ) 320 MG tablet Take 1 tablet (320 mg total) by mouth daily. 01/28/24   Wonda Sharper, MD    Physical Exam    Vital Signs:  Russell [name] does not have vital signs available for review today. 144/77  Given telephonic nature of communication, physical exam is limited. AAOx3. NAD. Normal affect.  Speech and respirations are unlabored.  Accessory Clinical Findings    None  Assessment & Plan    1.  Preoperative Cardiovascular Risk Assessment: - Patient's RCRI score is 6.6%  The patient affirms he has been doing well without any new cardiac symptoms. They are able to achieve 6 METS without cardiac limitations. Therefore, based on ACC/AHA guidelines, the patient would be at acceptable risk for the planned procedure without further cardiovascular testing. The patient was advised that if he develops new symptoms prior to surgery to contact our office to arrange for a follow-up visit, and he verbalized understanding.   The patient was advised  that if he develops new symptoms prior to surgery to contact our office to arrange for a follow-up visit, and he verbalized understanding. A copy of this note will be routed to requesting surgeon.  Patient can hold ASA 81 mg 7 days prior to procedure and should restart on the advisement of the surgical team.  Time:   Today, I have spent 7 minutes with the patient with telehealth technology discussing medical history, symptoms, and management plan.     Wyn Raddle, Jackee Shove, NP  06/30/2024, 7:14 AM

## 2024-07-07 ENCOUNTER — Ambulatory Visit: Payer: Self-pay | Admitting: General Surgery

## 2024-07-23 ENCOUNTER — Other Ambulatory Visit: Payer: Self-pay | Admitting: Cardiovascular Disease

## 2024-07-26 NOTE — Progress Notes (Signed)
 COVID Vaccine received:  []  No [x]  Yes Date of any COVID positive Test in last 90 days:  PCP - Comer Greet, MD ? Cardiologist - Ozell Fell, MD, Jackee Alberts, NP  cardiac clearance in 06-30-24 note  Chest x-ray -  EKG -  09-04-2023   Stress Test -  09-02-2022  ECHO -  Cardiac Cath -  CT Coronary Calcium  score:   Bowel Prep - [x]  No  []   Yes ______  Pacemaker / ICD device [x]  No []  Yes   Spinal Cord Stimulator:[x]  No []  Yes       History of Sleep Apnea? [x]  No []  Yes   CPAP used?- [x]  No []  Yes    Patient has: [x]  NO Hx DM   []  Pre-DM   []  DM1  []   DM2 Does the patient monitor blood sugar?   [x]  N/A   []  No []  Yes   Blood Thinner / Instructions: none Aspirin Instructions:   ASA 81 mg  hold 5-7 days  Dental hx: []  Dentures:  []  N/A      []  Bridge or Partial:                   []  Loose or Damaged teeth:   Comments:   Activity level: Able to walk up 2 flights of stairs without becoming significantly short of breath or having chest pain?  []  No   []    Yes  Patient can perform ADLs without assistance. []  No   []   Yes  Anesthesia review: CAD- CABG x 5 in 2003 by Dr. Lucas, CKD2, HTN, GERD  Patient denies any S&S of respiratory illness or Covid - no shortness of breath, fever, cough or chest pain at PAT appointment.  Patient verbalized understanding and agreement to the Pre-Surgical Instructions that were given to them at this PAT appointment. Patient was also educated of the need to review these PAT instructions again prior to his surgery.I reviewed the appropriate phone numbers to call if they have any and questions or concerns.

## 2024-07-26 NOTE — Patient Instructions (Signed)
 SURGICAL WAITING ROOM VISITATION Patients having surgery or a procedure may have no more than 2 support people in the waiting area - these visitors may rotate in the visitor waiting room.   If the patient needs to stay at the hospital during part of their recovery, the visitor guidelines for inpatient rooms apply.  PRE-OP VISITATION  Pre-op nurse will coordinate an appropriate time for 1 support person to accompany the patient in pre-op.  This support person may not rotate.  This visitor will be contacted when the time is appropriate for the visitor to come back in the pre-op area.  Please refer to the [facility] website for the visitor guidelines for Inpatients (after your surgery is over and you are in a regular room).  You are not required to quarantine at this time prior to your surgery. However, you must do this: Hand Hygiene often Do NOT share personal items Notify your provider if you are in close contact with someone who has COVID or you develop fever 100.4 or greater, new onset of sneezing, cough, sore throat, shortness of breath or body aches.  If you test positive for Covid or have been in contact with anyone that has tested positive in the last 10 days please notify you surgeon.    Your procedure is scheduled on:  TUESDAY  08-09-2024  Report to [facility] Main Entrance: [address] entrance where the [employer] is available.   Report to admitting at:  05:15   AM  Call this number if you have any questions or problems the morning of surgery [phone]  FOLLOW ANY ADDITIONAL PRE OP INSTRUCTIONS YOU RECEIVED FROM YOUR SURGEON'S OFFICE!!!  Do not eat food after Midnight the night prior to your surgery/procedure.  After Midnight you may have the following liquids until  04:30 AM DAY OF SURGERY  Clear Liquid Diet Water Black Coffee (sugar ok, NO MILK/CREAM OR CREAMERS)  Tea (sugar ok, NO MILK/CREAM OR CREAMERS) regular and decaf                             Plain  Jell-O  with no fruit (NO RED)                                           Fruit ices (not with fruit pulp, NO RED)                                     Popsicles (NO RED)                                                                  Juice: NO CITRUS JUICES: only apple, WHITE grape, WHITE cranberry Sports drinks like Gatorade or Powerade (NO RED)                  Oral Hygiene is also important to reduce your risk of infection.        Remember - BRUSH YOUR TEETH THE MORNING OF SURGERY WITH YOUR REGULAR TOOTHPASTE  Do NOT smoke  after Midnight the night before surgery.  STOP TAKING all Vitamins, Herbs and supplements 1 week before your surgery.   Take ONLY these medicines the morning of surgery with A SIP OF WATER: Amlodipine .   DO NOT TAKE Valsartan  on the morning of your surgery.                     You may not have any metal on your body including  jewelry, and body piercing  Do not wear  lotions, powders, cologne, or deodorant  Men may shave face and neck.  Contacts, Hearing Aids, dentures or bridgework may not be worn into surgery. DENTURES WILL BE REMOVED PRIOR TO SURGERY PLEASE DO NOT APPLY Poly grip OR ADHESIVES!!!  Patients discharged on the day of surgery will not be allowed to drive home.  Someone NEEDS to stay with you for the first 24 hours after anesthesia.  Do not bring your home medications to the hospital. The Pharmacy will dispense medications listed on your medication list to you during your admission in the Hospital.  Special Instructions: Bring a copy of your healthcare power of attorney and living will documents the day of surgery, if you wish to have them scanned into your [facility]- EPIC  Please read over the following fact sheets you were given: IF YOU HAVE QUESTIONS ABOUT YOUR PRE-OP INSTRUCTIONS, PLEASE CALL [phone]   [facility] - Preparing for Surgery Before surgery, you can play an important role.  Because skin is not sterile,  your skin needs to be as free of germs as possible.  You can reduce the number of germs on your skin by washing with CHG (chlorahexidine gluconate) soap before surgery.  CHG is an antiseptic cleaner which kills germs and bonds with the skin to continue killing germs even after washing. Please DO NOT use if you have an allergy to CHG or antibacterial soaps.  If your skin becomes reddened/irritated stop using the CHG and inform your nurse when you arrive at Short Stay. Do not shave (including legs and underarms) for at least 48 hours prior to the first CHG shower.  You may shave your face/neck.  Please follow these instructions carefully:  1.  Shower with CHG Soap the night before surgery and the  morning of surgery.  2.  If you choose to wash your hair, wash your hair first as usual with your normal  shampoo.  3.  After you shampoo, rinse your hair and body thoroughly to remove the shampoo.                             4.  Use CHG as you would any other liquid soap.  You can apply chg directly to the skin and wash.  Gently with a scrungie or clean washcloth.  5.  Apply the CHG Soap to your body ONLY FROM THE NECK DOWN.   Do not use on face/ open                           Wound or open sores. Avoid contact with eyes, ears mouth and genitals (private parts).                       Wash face,  Genitals (private parts) with your normal soap.             6.  Wash  thoroughly, paying special attention to the area where your  surgery  will be performed.  7.  Thoroughly rinse your body with warm water from the neck down.  8.  DO NOT shower/wash with your normal soap after using and rinsing off the CHG Soap.            9.  Pat yourself dry with a clean towel.            10.  Wear clean pajamas.            11.  Place clean sheets on your bed the night of your first shower and do not  sleep with pets.  ON THE DAY OF SURGERY : Do not apply any lotions/deodorants the morning of surgery.  Please wear clean clothes  to the hospital/surgery center.     FAILURE TO FOLLOW THESE INSTRUCTIONS MAY RESULT IN THE CANCELLATION OF YOUR SURGERY  PATIENT SIGNATURE_________________________________  NURSE SIGNATURE__________________________________  ________________________________________________________________________

## 2024-07-27 ENCOUNTER — Other Ambulatory Visit: Payer: Self-pay

## 2024-07-27 ENCOUNTER — Other Ambulatory Visit: Payer: Self-pay | Admitting: Cardiovascular Disease

## 2024-07-27 ENCOUNTER — Encounter (HOSPITAL_COMMUNITY): Payer: Self-pay

## 2024-07-27 ENCOUNTER — Encounter (HOSPITAL_COMMUNITY)
Admission: RE | Admit: 2024-07-27 | Discharge: 2024-07-27 | Disposition: A | Discharge status: Home / Self Care | Source: Ambulatory Visit | Attending: General Surgery | Admitting: General Surgery

## 2024-07-27 VITALS — BP 134/68 | HR 62 | Temp 98.1°F | Resp 14 | Ht 72.0 in | Wt 178.0 lb

## 2024-07-27 DIAGNOSIS — K4091 Unilateral inguinal hernia, without obstruction or gangrene, recurrent: Secondary | ICD-10-CM | POA: Insufficient documentation

## 2024-07-27 DIAGNOSIS — N182 Chronic kidney disease, stage 2 (mild): Secondary | ICD-10-CM | POA: Insufficient documentation

## 2024-07-27 DIAGNOSIS — I10 Essential (primary) hypertension: Secondary | ICD-10-CM

## 2024-07-27 DIAGNOSIS — M1990 Unspecified osteoarthritis, unspecified site: Secondary | ICD-10-CM | POA: Diagnosis not present

## 2024-07-27 DIAGNOSIS — Z01812 Encounter for preprocedural laboratory examination: Secondary | ICD-10-CM | POA: Insufficient documentation

## 2024-07-27 DIAGNOSIS — Z01818 Encounter for other preprocedural examination: Secondary | ICD-10-CM

## 2024-07-27 DIAGNOSIS — I129 Hypertensive chronic kidney disease with stage 1 through stage 4 chronic kidney disease, or unspecified chronic kidney disease: Secondary | ICD-10-CM | POA: Diagnosis not present

## 2024-07-27 DIAGNOSIS — I25810 Atherosclerosis of coronary artery bypass graft(s) without angina pectoris: Secondary | ICD-10-CM | POA: Diagnosis not present

## 2024-07-27 HISTORY — DX: Basal cell carcinoma of skin of other parts of face: C44.319

## 2024-07-27 HISTORY — DX: Unspecified osteoarthritis, unspecified site: M19.90

## 2024-07-27 LAB — BASIC METABOLIC PANEL WITH GFR
Anion gap: 11 (ref 5–15)
BUN: 19 mg/dL (ref 8–23)
CO2: 25 mmol/L (ref 22–32)
Calcium: 9.3 mg/dL (ref 8.9–10.3)
Chloride: 104 mmol/L (ref 98–111)
Creatinine, Ser: 1.31 mg/dL — ABNORMAL HIGH (ref 0.61–1.24)
GFR, Estimated: 54 mL/min — ABNORMAL LOW (ref >60)
Glucose, Bld: 87 mg/dL (ref 70–99)
Potassium: 3.9 mmol/L (ref 3.5–5.1)
Sodium: 139 mmol/L (ref 135–145)

## 2024-07-27 LAB — CBC
HCT: 44.1 % (ref 39.0–52.0)
Hemoglobin: 14.1 g/dL (ref 13.0–17.0)
MCH: 29.3 pg (ref 26.0–34.0)
MCHC: 32 g/dL (ref 30.0–36.0)
MCV: 91.5 fL (ref 80.0–100.0)
Platelets: 219 K/uL (ref 150–400)
RBC: 4.82 MIL/uL (ref 4.22–5.81)
RDW: 13.5 % (ref 11.5–15.5)
WBC: 7 K/uL (ref 4.0–10.5)
nRBC: 0 % (ref 0.0–0.2)

## 2024-07-28 NOTE — Anesthesia Preprocedure Evaluation (Addendum)
 Anesthesia Evaluation  Patient identified by MRN, date of birth, ID band Patient awake    Reviewed: Allergy & Precautions, NPO status , Patient's Chart, lab work & pertinent test results  History of Anesthesia Complications Negative for: history of anesthetic complications  Airway Mallampati: II  TM Distance: >3 FB Neck ROM: Full    Dental no notable dental hx. (+) Teeth Intact   Pulmonary neg pulmonary ROS, neg sleep apnea, neg COPD, Patient abstained from smoking.Not current smoker   Pulmonary exam normal breath sounds clear to auscultation       Cardiovascular Exercise Tolerance: Good METShypertension, + CAD and + CABG  (-) Past MI (-) dysrhythmias  Rhythm:Regular Rate:Normal - Systolic murmurs   Patient follows with Cardiology for hx of CAD s/p CABG (LIMA to LAD, saphenous vein graft to diagonal, saphenous vein graft to circumflex OM, and saphenous vein graft to the second and third OM branches) in 2003. Had low risk exercise tolerance test in 08/2022. Last echo was in 2010 and showed low-normal EF 50-55%. Last seen by Dr. Wonda on 09/04/2023. Stable at that visit. Advised f/u in 1 year. Had cardiac clearance tele visit on 06/30/24 and was cleared:   Preoperative Cardiovascular Risk Assessment: - Patient's RCRI score is 6.6%   The patient affirms he has been doing well without any new cardiac symptoms. They are able to achieve 6 METS without cardiac limitations. Therefore, based on ACC/AHA guidelines, the patient would be at acceptable risk for the planned procedure without further cardiovascular testing. The patient was advised that if he develops new symptoms prior to surgery to contact our office to arrange for a follow-up visit, and he verbalized understanding    Neuro/Psych negative neurological ROS  negative psych ROS   GI/Hepatic PUD,GERD  Controlled,,(+)     (-) substance abuse    Endo/Other  neg diabetes     Renal/GU CRFRenal disease     Musculoskeletal   Abdominal   Peds  Hematology   Anesthesia Other Findings Past Medical History: 2005: Adenomatous colon polyp No date: Arthritis No date: Basal cell carcinoma (BCC) of forehead 2003: Blood transfusion No date: BPH (benign prostatic hypertrophy)     Comment:  Hx of post-op acute urinary retention 2019. 2003: CAD (coronary artery disease)     Comment:  CABG 2003.  ETT POSITIVE 09/2018-->10/04/2018 myocard               perf imaging showed NO ISCHEMIA, EF 55-65% No date: Chronic renal insufficiency, stage II (mild)     Comment:  CrCl 60s No date: Chronic retention of urine     Comment:  secondary to severe prostate enlargement and prostate               ca--manaed by prostate cryoablation in 04/2018 No date: Elevated PSA     Comment:  Prostate bx x 2 (BPH only).  As of 08/2017, PSA               continues to rise +abnl prostate MRI, and Dr. 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  Bx               done: +prostate cancer cT1c, intermediate risk categ,  desires cryoablation as of 02/28/18 urol f/u. summer 2018: Gout     Comment:  left elbow 12/2014: Herpes zoster     Comment:  R side of chin (V3 area) No date: Hyperlipidemia No date: Hypertension No date: Prostate cancer (HCC)     Comment:  Cryoablation 04/2018: doing fine as of urol f/u               06/2018-->plan for annual PSA surveillance by urol No date: Right inguinal hernia     Comment:  Repaired 2019 1981: Ulcer No date: Vitreous detachment of both eyes     Comment:  + cataracts, not ready for surgery  Reproductive/Obstetrics                              Anesthesia Physical Anesthesia Plan  ASA: 3  Anesthesia Plan: General   Post-op Pain Management: Tylenol PO (pre-op)* and Gabapentin PO (pre-op)*   Induction:  Intravenous  PONV Risk Score and Plan: 3 and Ondansetron and Dexamethasone  Airway Management Planned: Oral ETT  Additional Equipment: None  Intra-op Plan:   Post-operative Plan: Extubation in OR  Informed Consent: I have reviewed the patients History and Physical, chart, labs and discussed the procedure including the risks, benefits and alternatives for the proposed anesthesia with the patient or authorized representative who has indicated his/her understanding and acceptance.     Dental advisory given  Plan Discussed with: CRNA and Surgeon  Anesthesia Plan Comments: (Discussed risks of anesthesia with patient, including PONV, sore throat, lip/dental/eye damage, post operative cognitive dysfunction. Rare risks discussed as well, such as cardiorespiratory and neurological sequelae, and allergic reactions. Discussed the role of CRNA in patient's perioperative care. Patient understands. Discussed r/b/a of TAP block per surgeon request, however patient ultimately declined, citing prior open hernia surgeries without many pain issues afterward.)         Anesthesia Quick Evaluation

## 2024-07-28 NOTE — Progress Notes (Signed)
 Case: [id number] Date/Time: 08/09/24 0715   Procedure: REPAIR, HERNIA, INGUINAL, ROBOT-ASSISTED, LAPAROSCOPIC, USING MESH (Right) - GEN/TAP BLOCK   Anesthesia type: General   Diagnosis: Recurrent right inguinal hernia [K40.91]   Pre-op diagnosis: RECURRENT RIGHT INGUINAL HERNIA   Location: [facility] ROOM 02 / [facility]   Surgeons: Polly Cordella LABOR, MD       DISCUSSION: Russell [name] is an 83 yo male with PMH of HTN, CAD s/p CABG x5 (2003), CKD3, prostate cancer s/p cryoablation (12/2023), arthritis.  Prior complications from anesthesia include post op urinary retention.  Patient follows with Cardiology for hx of CAD s/p CABG (LIMA to LAD, saphenous vein graft to diagonal, saphenous vein graft to circumflex OM, and saphenous vein graft to the second and third OM branches) in 2003. Had low risk exercise tolerance test in 08/2022. Last echo was in 2010 and showed low-normal EF 50-55%. Last seen by Dr. Wonda on 09/04/2023. Stable at that visit. Advised f/u in 1 year. Had cardiac clearance tele visit on 06/30/24 and was cleared:  Preoperative Cardiovascular Risk Assessment: - Patient's RCRI score is 6.6%   The patient affirms he has been doing well without any new cardiac symptoms. They are able to achieve 6 METS without cardiac limitations. Therefore, based on ACC/AHA guidelines, the patient would be at acceptable risk for the planned procedure without further cardiovascular testing. The patient was advised that if he develops new symptoms prior to surgery to contact our office to arrange for a follow-up visit, and he verbalized understanding  VS: BP 134/68 Comment: right arm sitting  Pulse 62   Temp 36.7 C (Oral)   Resp 14   Ht 6' (1.829 m)   Wt 80.7 kg   SpO2 96%   BMI 24.14 kg/m   PROVIDERS: Mahlon Comer BRAVO, MD   LABS: Labs reviewed: Acceptable for surgery. (all labs ordered are listed, but only abnormal results are displayed)  Labs Reviewed  BASIC METABOLIC PANEL WITH GFR -  Abnormal; Notable for the following components:      Result Value   Creatinine, Ser 1.31 (*)    GFR, Estimated 54 (*)    All other components within normal limits  CBC     IMAGES:   EKG 09/04/23:  Sinus bradycardia with 1st degree A-V block Possible Inferior infarct , age undetermined When compared with ECG of 06-Oct-2016 11:17, Borderline criteria for Inferior infarct are now Present Nonspecific T wave abnormality now evident in Inferior leads Otherwise no significant change  Stress test 09/02/2022:    A Bruce protocol stress test was performed. Exercise capacity was normal. Patient exercised for 8 min and 0 sec. Maximum HR of 144 bpm. MPHR 102.0 %. Peak METS 15.0 . The patient experienced no angina during the test. Normal blood pressure and normal heart rate response noted during stress. Heart rate recovery was normal.   No ST deviation was noted. The ECG was negative for ischemia.   This is a low risk study.   Echo 06/22/2009:  Left ventricle: Septal hypokinesis The cavity size was normal.   Systolic function was normal. The estimated ejection fraction was in   the range of 50% to 55%.   Aortic valve: Mildly thickened leaflets.   Aorta: The aorta was normal, not dilated, and non-diseased.   Mitral valve:  Doppler: Trivial regurgitation.  Peak gradient: 3mm   Hg (D).   Left atrium: The atrium was mildly dilated.   Atrial septum: No defect or patent foramen ovale was identified.  Right ventricle: The cavity size was normal. Wall thickness was   normal. Systolic function was normal.   Tricuspid valve:  Doppler: Mild regurgitation.   Right atrium: The atrium was normal in size.   Pericardium: The pericardium was normal in appearance.   Post procedure conclusions   Ascending Aorta:    - The aorta was normal, not dilated, and non-diseased.   Past Medical History:  Diagnosis Date   Adenomatous colon polyp 2005   Arthritis    Basal cell carcinoma (BCC) of forehead     Blood transfusion 2003   BPH (benign prostatic hypertrophy)    Hx of post-op acute urinary retention 2019.   CAD (coronary artery disease) 2003   CABG 2003.  ETT POSITIVE 09/2018-->10/04/2018 myocard perf imaging showed NO ISCHEMIA, EF 55-65%   Chronic renal insufficiency, stage II (mild)    CrCl 60s   Chronic retention of urine    secondary to severe prostate enlargement and prostate ca--manaed by prostate cryoablation in 04/2018   Elevated PSA    Prostate bx x 2 (BPH only).  As of 08/2017, PSA continues to rise +abnl prostate MRI, and Dr. 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  Bx done: +prostate cancer cT1c, intermediate risk categ, desires cryoablation as of 02/28/18 urol f/u.   Gout summer 2018   left elbow   Herpes zoster 12/2014   R side of chin (V3 area)   Hyperlipidemia    Hypertension    Prostate cancer (HCC)    Cryoablation 04/2018: doing fine as of urol f/u 06/2018-->plan for annual PSA surveillance by urol   Right inguinal hernia    Repaired 2019   Ulcer 1981   Vitreous detachment of both eyes    + cataracts, not ready for surgery    Past Surgical History:  Procedure Laterality Date   BASAL CELL CARCINOMA EXCISION  04/2011   Mohs , Dr Gabriel   BIOPSY PROSTATE  approx 2010   Dr. Janit.   CARDIOVASCULAR STRESS TEST  08/2014; 08/2015   2015 Low risk: no ischemia.  Inferior/inferolateral scar and slight decreased mobility of LV wall in this region, EF normal.  2016 ETT low risk.   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  09/2018 POSITIVE ETT--cards f/u --> 10/04/2018 Myocard perf imaging showed NO ISCHEMIA, EF 55-65%.   COLONOSCOPY     12/2011 'tics, int hem, o/w NORMAL.  Repeat 02/2017 showed no polyps, +internal hem and sigmoid diverticulosis.. (negative 2008, polyp 2005; Dr Aneita). No repeat recommended due to age.   CORONARY  ARTERY BYPASS GRAFT  2003   5 vessel (Dr. Lucas)   FLEXIBLE SIGMOIDOSCOPY     1992, 1997   ganglion cystectomy      hand   HEMORRHOID SURGERY     Dr. Eletha FONTANA HERNIA REPAIR  2019   left and then Right (mesh)   LUMBAR LAMINECTOMY     Dr. Sofie   POLYPECTOMY     TONSILLECTOMY     UMBILICAL HERNIA REPAIR  2018    MEDICATIONS:  amLODipine  (NORVASC ) 10 MG tablet   aspirin EC 81 MG tablet   ibuprofen (ADVIL) 200 MG tablet   rosuvastatin  (CRESTOR ) 5 MG tablet   valsartan  (DIOVAN ) 320 MG tablet   No current facility-administered medications for this encounter.    Burnard CHRISTELLA Senna, PA-C [facility] Short Stay/Anesthesiology  [facility] Phone [phone] 07/28/2024 2:56 PM

## 2024-08-03 ENCOUNTER — Encounter: Payer: Self-pay | Admitting: Cardiovascular Disease

## 2024-08-03 MED ORDER — ROSUVASTATIN CALCIUM 5 MG PO TABS: ORAL_TABLET | ORAL | 0 refills | Status: DC

## 2024-08-09 ENCOUNTER — Encounter (HOSPITAL_COMMUNITY)
Admission: RE | Disposition: A | Discharge status: Home / Self Care | Payer: Self-pay | Source: Home / Self Care | Attending: General Surgery

## 2024-08-09 ENCOUNTER — Ambulatory Visit (HOSPITAL_COMMUNITY)
Admission: RE | Admit: 2024-08-09 | Discharge: 2024-08-09 | Disposition: A | Discharge status: Home / Self Care | Attending: General Surgery | Admitting: General Surgery

## 2024-08-09 ENCOUNTER — Ambulatory Visit (HOSPITAL_BASED_OUTPATIENT_CLINIC_OR_DEPARTMENT_OTHER): Payer: Self-pay | Admitting: Certified Registered"

## 2024-08-09 ENCOUNTER — Ambulatory Visit (HOSPITAL_COMMUNITY): Payer: Self-pay | Admitting: Physician Assistant

## 2024-08-09 ENCOUNTER — Other Ambulatory Visit (HOSPITAL_COMMUNITY): Payer: Self-pay

## 2024-08-09 ENCOUNTER — Encounter (HOSPITAL_COMMUNITY): Payer: Self-pay | Admitting: General Surgery

## 2024-08-09 ENCOUNTER — Other Ambulatory Visit: Payer: Self-pay

## 2024-08-09 DIAGNOSIS — K4090 Unilateral inguinal hernia, without obstruction or gangrene, not specified as recurrent: Secondary | ICD-10-CM | POA: Diagnosis not present

## 2024-08-09 DIAGNOSIS — K279 Peptic ulcer, site unspecified, unspecified as acute or chronic, without hemorrhage or perforation: Secondary | ICD-10-CM | POA: Diagnosis not present

## 2024-08-09 DIAGNOSIS — I129 Hypertensive chronic kidney disease with stage 1 through stage 4 chronic kidney disease, or unspecified chronic kidney disease: Secondary | ICD-10-CM | POA: Diagnosis not present

## 2024-08-09 DIAGNOSIS — I10 Essential (primary) hypertension: Secondary | ICD-10-CM | POA: Diagnosis not present

## 2024-08-09 DIAGNOSIS — Z79899 Other long term (current) drug therapy: Secondary | ICD-10-CM | POA: Insufficient documentation

## 2024-08-09 DIAGNOSIS — K219 Gastro-esophageal reflux disease without esophagitis: Secondary | ICD-10-CM | POA: Diagnosis not present

## 2024-08-09 DIAGNOSIS — N182 Chronic kidney disease, stage 2 (mild): Secondary | ICD-10-CM | POA: Insufficient documentation

## 2024-08-09 DIAGNOSIS — Z951 Presence of aortocoronary bypass graft: Secondary | ICD-10-CM | POA: Diagnosis not present

## 2024-08-09 DIAGNOSIS — I2510 Atherosclerotic heart disease of native coronary artery without angina pectoris: Secondary | ICD-10-CM | POA: Diagnosis not present

## 2024-08-09 DIAGNOSIS — Z7982 Long term (current) use of aspirin: Secondary | ICD-10-CM | POA: Insufficient documentation

## 2024-08-09 DIAGNOSIS — E785 Hyperlipidemia, unspecified: Secondary | ICD-10-CM

## 2024-08-09 DIAGNOSIS — K4091 Unilateral inguinal hernia, without obstruction or gangrene, recurrent: Secondary | ICD-10-CM | POA: Insufficient documentation

## 2024-08-09 HISTORY — PX: XI ROBOTIC ASSISTED INGUINAL HERNIA REPAIR WITH MESH: SHX6706

## 2024-08-09 SURGERY — REPAIR, HERNIA, INGUINAL, ROBOT-ASSISTED, LAPAROSCOPIC, USING MESH
Anesthesia: General | Laterality: Right

## 2024-08-09 MED ORDER — BUPIVACAINE-EPINEPHRINE (PF) 0.25% -1:200000 IJ SOLN
INTRAMUSCULAR | Status: AC
Start: 2024-08-09 — End: 2024-08-09
  Filled 2024-08-09: qty 30

## 2024-08-09 MED ORDER — FENTANYL CITRATE (PF) 100 MCG/2ML IJ SOLN
INTRAMUSCULAR | Status: AC
  Filled 2024-08-09: qty 2

## 2024-08-09 MED ORDER — DEXAMETHASONE SODIUM PHOSPHATE 10 MG/ML IJ SOLN
INTRAMUSCULAR | Status: DC | PRN
  Administered 2024-08-09: 4 mg via INTRAVENOUS

## 2024-08-09 MED ORDER — SUGAMMADEX SODIUM 200 MG/2ML IV SOLN
INTRAVENOUS | Status: AC
Start: 2024-08-09 — End: 2024-08-09
  Filled 2024-08-09: qty 2

## 2024-08-09 MED ORDER — SUGAMMADEX SODIUM 200 MG/2ML IV SOLN
INTRAVENOUS | Status: DC | PRN
  Administered 2024-08-09: 200 mg via INTRAVENOUS

## 2024-08-09 MED ORDER — FENTANYL CITRATE (PF) 100 MCG/2ML IJ SOLN
INTRAMUSCULAR | Status: DC | PRN
  Administered 2024-08-09 (×2): 50 ug via INTRAVENOUS

## 2024-08-09 MED ORDER — PROPOFOL 10 MG/ML IV BOLUS
INTRAVENOUS | Status: AC
  Filled 2024-08-09: qty 20

## 2024-08-09 MED ORDER — IBUPROFEN 200 MG PO TABS
600.0000 mg | ORAL_TABLET | Freq: Four times a day (QID) | ORAL | 0 refills | Status: AC
  Filled 2024-08-09: qty 75, 7d supply, fill #0

## 2024-08-09 MED ORDER — PROPOFOL 10 MG/ML IV BOLUS
INTRAVENOUS | Status: AC
Start: 2024-08-09 — End: 2024-08-09
  Filled 2024-08-09: qty 20

## 2024-08-09 MED ORDER — ACETAMINOPHEN 325 MG PO TABS
650.0000 mg | ORAL_TABLET | Freq: Four times a day (QID) | ORAL | 0 refills | Status: AC
  Filled 2024-08-09: qty 50, 7d supply, fill #0

## 2024-08-09 MED ORDER — DEXAMETHASONE SODIUM PHOSPHATE 10 MG/ML IJ SOLN
INTRAMUSCULAR | Status: AC
  Filled 2024-08-09: qty 1

## 2024-08-09 MED ORDER — 0.9 % SODIUM CHLORIDE (POUR BTL) OPTIME
TOPICAL | Status: DC | PRN
  Administered 2024-08-09: 1000 mL

## 2024-08-09 MED ORDER — OXYCODONE HCL 5 MG PO TABS
5.0000 mg | ORAL_TABLET | Freq: Three times a day (TID) | ORAL | 0 refills | Status: AC | PRN
  Filled 2024-08-09: qty 12, 4d supply, fill #0

## 2024-08-09 MED ORDER — OXYCODONE HCL 5 MG PO TABS: 5.0000 mg | ORAL_TABLET | Freq: Once | ORAL | Status: DC | PRN

## 2024-08-09 MED ORDER — LIDOCAINE HCL (CARDIAC) PF 100 MG/5ML IV SOSY
PREFILLED_SYRINGE | INTRAVENOUS | Status: DC | PRN
  Administered 2024-08-09: 60 mg via INTRAVENOUS

## 2024-08-09 MED ORDER — ORAL CARE MOUTH RINSE: 15.0000 mL | Freq: Once | OROMUCOSAL | Status: AC

## 2024-08-09 MED ORDER — ROCURONIUM BROMIDE 10 MG/ML (PF) SYRINGE
PREFILLED_SYRINGE | INTRAVENOUS | Status: AC
  Filled 2024-08-09: qty 10

## 2024-08-09 MED ORDER — PROPOFOL 10 MG/ML IV BOLUS
INTRAVENOUS | Status: DC | PRN
  Administered 2024-08-09: 120 mg via INTRAVENOUS

## 2024-08-09 MED ORDER — CHLORHEXIDINE GLUCONATE CLOTH 2 % EX PADS: 6.0000 | MEDICATED_PAD | Freq: Once | CUTANEOUS | Status: DC

## 2024-08-09 MED ORDER — ACETAMINOPHEN 500 MG PO TABS
1000.0000 mg | ORAL_TABLET | ORAL | Status: AC
Start: 2024-08-09 — End: 2024-08-09
  Administered 2024-08-09: 1000 mg via ORAL
  Filled 2024-08-09: qty 2

## 2024-08-09 MED ORDER — DROPERIDOL 2.5 MG/ML IJ SOLN: 0.6250 mg | Freq: Once | INTRAMUSCULAR | Status: DC | PRN

## 2024-08-09 MED ORDER — FENTANYL CITRATE PF 50 MCG/ML IJ SOSY: 25.0000 ug | PREFILLED_SYRINGE | INTRAMUSCULAR | Status: DC | PRN

## 2024-08-09 MED ORDER — ROCURONIUM BROMIDE 10 MG/ML (PF) SYRINGE
PREFILLED_SYRINGE | INTRAVENOUS | Status: DC | PRN
  Administered 2024-08-09: 60 mg via INTRAVENOUS
  Administered 2024-08-09 (×2): 10 mg via INTRAVENOUS

## 2024-08-09 MED ORDER — CHLORHEXIDINE GLUCONATE 0.12 % MT SOLN
15.0000 mL | Freq: Once | OROMUCOSAL | Status: AC
  Administered 2024-08-09: 15 mL via OROMUCOSAL

## 2024-08-09 MED ORDER — PHENYLEPHRINE HCL-NACL 20-0.9 MG/250ML-% IV SOLN
INTRAVENOUS | Status: DC | PRN
  Administered 2024-08-09: 20 ug/min via INTRAVENOUS

## 2024-08-09 MED ORDER — GABAPENTIN 300 MG PO CAPS
300.0000 mg | ORAL_CAPSULE | ORAL | Status: AC
Start: 2024-08-09 — End: 2024-08-09
  Administered 2024-08-09: 300 mg via ORAL
  Filled 2024-08-09: qty 1

## 2024-08-09 MED ORDER — CEFAZOLIN SODIUM-DEXTROSE 2-4 GM/100ML-% IV SOLN
2.0000 g | Freq: Once | INTRAVENOUS | Status: AC
  Administered 2024-08-09: 2 g via INTRAVENOUS
  Filled 2024-08-09: qty 100

## 2024-08-09 MED ORDER — ONDANSETRON HCL 4 MG/2ML IJ SOLN
INTRAMUSCULAR | Status: AC
  Filled 2024-08-09: qty 2

## 2024-08-09 MED ORDER — OXYCODONE HCL 5 MG/5ML PO SOLN: 5.0000 mg | Freq: Once | ORAL | Status: DC | PRN

## 2024-08-09 MED ORDER — ONDANSETRON HCL 4 MG/2ML IJ SOLN
INTRAMUSCULAR | Status: DC | PRN
  Administered 2024-08-09: 4 mg via INTRAVENOUS

## 2024-08-09 MED ORDER — LACTATED RINGERS IV SOLN: INTRAVENOUS | Status: DC

## 2024-08-09 MED ORDER — BUPIVACAINE-EPINEPHRINE 0.25% -1:200000 IJ SOLN
INTRAMUSCULAR | Status: DC | PRN
  Administered 2024-08-09: 30 mL

## 2024-08-09 SURGICAL SUPPLY — 47 items
APPLICATOR COTTON TIP 6 STRL (MISCELLANEOUS) IMPLANT
BAG COUNTER SPONGE SURGICOUNT (BAG) IMPLANT
BLADE SURG SZ11 CARB STEEL (BLADE) ×1 IMPLANT
CHLORAPREP W/TINT 26 (MISCELLANEOUS) ×1 IMPLANT
COVER MAYO STAND STRL (DRAPES) ×1 IMPLANT
COVER SURGICAL LIGHT HANDLE (MISCELLANEOUS) ×1 IMPLANT
COVER TIP SHEARS 8 DVNC (MISCELLANEOUS) ×1 IMPLANT
DERMABOND ADVANCED .7 DNX12 (GAUZE/BANDAGES/DRESSINGS) ×1 IMPLANT
DRAPE ARM DVNC X/XI (DISPOSABLE) ×3 IMPLANT
DRAPE COLUMN DVNC XI (DISPOSABLE) ×1 IMPLANT
DRIVER NDL MEGA SUTCUT DVNCXI (INSTRUMENTS) IMPLANT
DRIVER NDLE MEGA SUTCUT DVNCXI (INSTRUMENTS) IMPLANT
ELECT REM PT RETURN 15FT ADLT (MISCELLANEOUS) ×1 IMPLANT
FORCEPS BPLR 8 MD DVNC XI (FORCEP) ×1 IMPLANT
GAUZE 4X4 16PLY ~~LOC~~+RFID DBL (SPONGE) ×1 IMPLANT
GLOVE BIO SURGEON STRL SZ7 (GLOVE) ×2 IMPLANT
GOWN STRL REUS W/ TWL XL LVL3 (GOWN DISPOSABLE) ×2 IMPLANT
IRRIGATION SUCT STRKRFLW 2 WTP (MISCELLANEOUS) IMPLANT
KIT BASIN OR (CUSTOM PROCEDURE TRAY) ×1 IMPLANT
KIT TURNOVER KIT A (KITS) ×1 IMPLANT
MARKER SKIN DUAL TIP RULER LAB (MISCELLANEOUS) ×1 IMPLANT
MESH 3DMAX MID 5X7 RT XLRG (Mesh General) IMPLANT
NDL HYPO 22X1.5 SAFETY MO (MISCELLANEOUS) ×1 IMPLANT
NDL INSUFFLATION 14GA 120MM (NEEDLE) ×1 IMPLANT
NEEDLE HYPO 22X1.5 SAFETY MO (MISCELLANEOUS) ×1 IMPLANT
NEEDLE INSUFFLATION 14GA 120MM (NEEDLE) ×1 IMPLANT
OBTURATOR OPTICALSTD 8 DVNC (TROCAR) ×1 IMPLANT
PACK CARDIOVASCULAR III (CUSTOM PROCEDURE TRAY) ×1 IMPLANT
SCISSORS MNPLR CVD DVNC XI (INSTRUMENTS) ×1 IMPLANT
SEAL UNIV 5-12 XI (MISCELLANEOUS) ×3 IMPLANT
SOLUTION ANTFG W/FOAM PAD STRL (MISCELLANEOUS) ×1 IMPLANT
SOLUTION ELECTROSURG ANTI STCK (MISCELLANEOUS) ×1 IMPLANT
SPIKE FLUID TRANSFER (MISCELLANEOUS) ×1 IMPLANT
SUT MNCRL AB 4-0 PS2 18 (SUTURE) ×1 IMPLANT
SUT STRATA PDS 2-0 23 CT-1 (SUTURE) IMPLANT
SUT STRATAFIX SPIRAL PDS3-0 (SUTURE) ×1 IMPLANT
SUT VIC AB 2-0 SH 27X BRD (SUTURE) IMPLANT
SUT VIC AB 3-0 SH 27X BRD (SUTURE) ×1 IMPLANT
SUTURE STRATFX 0 PDS+ CT-2 23 (SUTURE) IMPLANT
SUTURE VLOC BRB 180 ABS3/0GR12 (SUTURE) IMPLANT
SYR 10ML LL (SYRINGE) ×1 IMPLANT
SYR 20ML LL LF (SYRINGE) ×1 IMPLANT
TAPE STRIPS DRAPE STRL (GAUZE/BANDAGES/DRESSINGS) ×1 IMPLANT
TOWEL GREEN STERILE FF (TOWEL DISPOSABLE) ×1 IMPLANT
TOWEL OR 17X26 10 PK STRL BLUE (TOWEL DISPOSABLE) ×1 IMPLANT
TROCAR Z-THREAD OPTICAL 5X100M (TROCAR) IMPLANT
TUBING INSUFFLATION 10FT LAP (TUBING) ×1 IMPLANT

## 2024-08-09 NOTE — Op Note (Signed)
 Russell [name] ([id number])  Operative Report   Date 08/09/2024  PREOPERATIVE DIAGNOSIS: Recurrent right Inguinal hernia  POSTOPERATIVE DIAGNOSIS: Same  PROCEDURE:  1.Robotic Right Inguinal Hernia Repair with Mesh (Bard 3D Midweight XL Right Polypropylene Mesh) 2. Right-sided transversus abdominus plane block for post-operative pain control   SURGEON: Cordella Idler, MD  ANESTHESIA: General Anesthesia    INTRAOPERATIVE FINDINGS: Separation of the mesh along the medial border, resulting in direct hernia.  IMPLANTS: Implant Name Type Inv. Item Serial No. Manufacturer Lot No. LRB No. Used Action  MESH 3DMAX MID 5X7 RT XLRG - [id number] Mesh General MESH 3DMAX MID 5X7 RT [employer] BARD ACCESS [id number] Right 1 Implanted    ESTIMATED BLOOD LOSS: Minimal  COMPLICATIONS: None  SPECIMENS: None  OPERATIVE INDICATIONS: Pt is a 83 y.o. male who presents with a recurrent right inguinal hernia.  The patient desires definitive repair.  The procedure's risks, benefits, and alternatives were explained to the patient.  Risks, including the risks of bleeding, infection, need for mesh removal, and potential for hernia recurrence, were discussed.  The patient agreed to proceed and signed informed consent in front of a witness.   DESCRIPTION OF PROCEDURE: After preoperatively identifying the patient in holding, the patient was brought to the operating room and placed supine on the operating room table.  Both arms were tucked and padded to avoid potential nerve injury.  Sequential Compression Devices were placed bilaterally.  After induction of general anesthesia, the patient was given the appropriate perioperative antibiotics.  The abdomen was prepped and draped in a typical sterile fashion.  A JACHO approved time out, where the name of the patient, the operation, and the intended site were confirmed. The abdomen was accessed with a Veress needle via the standard drop technique in the LUQ at  Palmer's point.  Insufflation was established.  An 8 mm optical port was placed under direct visualization in the RLQ.  A camera was introduced into the abdomen, and a thorough inspection of the abdomen was performed to confirm there was no additional pathology.  Two additional 8 mm robotic ports were placed laterally under direct visualization, one superior to the umbilicus and another in the RLQ. The patient was then placed into 15-degree Trendelenburg position to facilitate examination of the bilateral inguinal spaces.  A thorough inspection of the abdomen was undertaken.  A recurrent right inguinal hernia was identified and amenable to robotic repair. I then performed a right-sided transversus abdominus plane block under direct visualization to aid with post-operative pain control.       The [employer] robot was brought into the field and docked.  An 8 mm 30-degree scope was placed in the mid-abdominal port.  The peritoneum was taken down 6 cm superior to the hernia from the anterior abdominal wall, and dissection was taken inferiorly towards the hernia.  Medial to the epigastric vessels, the parietal compartment was dissected and completed to visualize the rectus muscle.  This dissection was carried down to the symphysis pubis and obturator foramen.  The retropubic space was dissected to expose at least 2 cm contralateral to the midline.  Cooper's ligament was exposed and cleared at least 2 cm below the ligament to ensure adequate space for the inferior border of the mesh.  Hesselbach's triangle was cleared, assessing for a direct hernia. I could visualize the mesh from his previous repair, with separation along the right medial border with herniated fat. The direct hernia was reduced, dissecting the contents away from  the border of the transversalis fascia.  Any herniated femoral contents were reduced as well.  Lateral to the epigastric vessels, the dissection was carried out into the visceral compartment,  continuing in the true preperitoneal plane.  This dissection continued until the cord structures were parietalized completely, allowing for continuous visualization of the reflected peritoneum with the line originating 2 cm below Cooper's medially and across the psoas muscle in the lateral compartment.  The internal ring was interrogated for a cord lipoma.  There was no major lipoma. Given that the hernia was safely away from the vessels and with concern for being able to get adequate mesh overlap due to the proximity of the bladder, I decided to perform primary closure of the defect. This was done with 0 symmetric stratafix.   Having achieved a complete dissection with a critical view of the entire myopectineal orifice, a piece of Bard 3D Midweight XL Right Polypropylene Mesh was introduced into the field.  It was centered at the iliopubic tract, with the medial side crossing the midline and the inferior edge positioned 2 cm below Cooper's ligament.  With complete coverage of the myopectineal orifice, the inferior edge of the peritoneum was posterior and inferior to the mesh.  The lateral aspect of the mesh extended 3-5 cm beyond the lateral edge of the psoas.  Cephalad retraction of the peritoneal flap did not cause lifting of the inferior mesh edge or cord structures.  The mesh was fixated using an interrupted 3-0 Vicryl suture placed to the ipsilateral Coopers ligament.  The peritoneal flap was closed with a running 3-0 Stratafix barbed suture.  A hole in the peritoneum was closed.  Hemostasis was assured in the entirety of the abdomen.  The two lateral ports were removed under direct visualization.  The abdomen was desufflated under direct visualization.  The port sites were closed, local anesthetic was infiltrated, and Dermabond was applied.  After confirming twice that the sponge, needle, and instrument counts were correct, the procedure was terminated, the patient was extubated and transferred to the  recovery room in stable condition.     DISPOSITION: Stable to PACU.   Electronically Signed By: Cordella DELENA Idler 08/09/2024 9:03 AM

## 2024-08-09 NOTE — Transfer of Care (Signed)
 Immediate Anesthesia Transfer of Care Note  Patient: Russell [name]  Procedure(s) Performed: REPAIR, HERNIA, INGUINAL, ROBOT-ASSISTED, LAPAROSCOPIC, USING MESH (Right)  Patient Location: PACU  Anesthesia Type:General  Level of Consciousness: awake, drowsy, and patient cooperative  Airway & Oxygen Therapy: Patient Spontanous Breathing and Patient connected to face mask oxygen  Post-op Assessment: Report given to RN and Post -op Vital signs reviewed and stable  Post vital signs: Reviewed and stable  Last Vitals:  Vitals Value Taken Time  BP 126/70   Temp    Pulse 61 08/09/24 09:17  Resp 12 08/09/24 09:17  SpO2 100 % 08/09/24 09:17  Vitals shown include unfiled device data.  Last Pain:  Vitals:   08/09/24 0628  TempSrc:   PainSc: 0-No pain      Patients Stated Pain Goal: 5 (08/09/24 9375)  Complications: No notable events documented.

## 2024-08-09 NOTE — Anesthesia Procedure Notes (Signed)
 Procedure Name: Intubation Date/Time: 08/09/2024 7:33 AM  Performed by: Metta Andrea NOVAK, CRNAPre-anesthesia Checklist: Patient identified, Emergency Drugs available, Suction available, Patient being monitored and Timeout performed Patient Re-evaluated:Patient Re-evaluated prior to induction Oxygen Delivery Method: Circle system utilized Preoxygenation: Pre-oxygenation with 100% oxygen Induction Type: IV induction Ventilation: Mask ventilation without difficulty Laryngoscope Size: Mac and 4 Grade View: Grade I Tube type: Oral Tube size: 7.5 mm Number of attempts: 1 Airway Equipment and Method: Stylet Placement Confirmation: ETT inserted through vocal cords under direct vision, positive ETCO2 and breath sounds checked- equal and bilateral Secured at: 23 cm Tube secured with: Tape Dental Injury: Teeth and Oropharynx as per pre-operative assessment

## 2024-08-09 NOTE — Anesthesia Postprocedure Evaluation (Signed)
 Anesthesia Post Note  Patient: Russell [name]  Procedure(s) Performed: REPAIR, HERNIA, INGUINAL, ROBOT-ASSISTED, LAPAROSCOPIC, USING MESH (Right)     Patient location during evaluation: PACU Anesthesia Type: General Level of consciousness: awake and alert Pain management: pain level controlled Vital Signs Assessment: post-procedure vital signs reviewed and stable Respiratory status: spontaneous breathing, nonlabored ventilation, respiratory function stable and patient connected to nasal cannula oxygen Cardiovascular status: blood pressure returned to baseline and stable Postop Assessment: no apparent nausea or vomiting Anesthetic complications: no   No notable events documented.  Last Vitals:  Vitals:   08/09/24 0930 08/09/24 0945  BP: 129/68 127/61  Pulse: (!) 57 (!) 56  Resp: 18 11  Temp:  (!) 36.4 C  SpO2: 93% 91%    Last Pain:  Vitals:   08/09/24 0945  TempSrc:   PainSc: 2                  Rome Ade

## 2024-08-09 NOTE — Discharge Instructions (Signed)

## 2024-08-09 NOTE — H&P (Signed)
 Russell [name] 08/10/1941  [id number].    HPI:  83 y/o M with a recurrent right inguinal hernia who presents for elective repair. He reports that he is in his usual state of health and denies any recent changes in medication.   ROS: Review of Systems  Constitutional: Negative.   HENT: Negative.    Eyes: Negative.   Respiratory: Negative.    Cardiovascular: Negative.   Gastrointestinal: Negative.   Genitourinary: Negative.   Musculoskeletal: Negative.   Skin: Negative.   Neurological: Negative.   Endo/Heme/Allergies: Negative.   Psychiatric/Behavioral: Negative.      Family History  Problem Relation Age of Onset   Heart attack Father 78       suicide post MI   Heart attack Brother 9   Aneurysm Mother 47       cns; pacer   Colon cancer Neg Hx    Esophageal cancer Neg Hx    Rectal cancer Neg Hx    Stomach cancer Neg Hx     Past Medical History:  Diagnosis Date   Adenomatous colon polyp 2005   Arthritis    Basal cell carcinoma (BCC) of forehead    Blood transfusion 2003   BPH (benign prostatic hypertrophy)    Hx of post-op acute urinary retention 2019.   CAD (coronary artery disease) 2003   CABG 2003.  ETT POSITIVE 09/2018-->10/04/2018 myocard perf imaging showed NO ISCHEMIA, EF 55-65%   Chronic renal insufficiency, stage II (mild)    CrCl 60s   Chronic retention of urine    secondary to severe prostate enlargement and prostate ca--manaed by prostate cryoablation in 04/2018   Elevated PSA    Prostate bx x 2 (BPH only).  As of 08/2017, PSA continues to rise +abnl prostate MRI, and Dr. 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  Bx done: +prostate cancer cT1c, intermediate risk categ, desires cryoablation as of 02/28/18 urol f/u.   Gout summer 2018   left elbow   Herpes zoster 12/2014   R side of chin (V3 area)   Hyperlipidemia    Hypertension    Prostate cancer (HCC)    Cryoablation  04/2018: doing fine as of urol f/u 06/2018-->plan for annual PSA surveillance by urol   Right inguinal hernia    Repaired 2019   Ulcer 1981   Vitreous detachment of both eyes    + cataracts, not ready for surgery    Past Surgical History:  Procedure Laterality Date   BASAL CELL CARCINOMA EXCISION  04/2011   Mohs , Dr Gabriel   BIOPSY PROSTATE  approx 2010   Dr. Janit.   CARDIOVASCULAR STRESS TEST  08/2014; 08/2015   2015 Low risk: no ischemia.  Inferior/inferolateral scar and slight decreased mobility of LV wall in this region, EF normal.  2016 ETT low risk.   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  09/2018 POSITIVE ETT--cards f/u --> 10/04/2018 Myocard perf imaging showed NO ISCHEMIA, EF 55-65%.   COLONOSCOPY     12/2011 'tics, int hem, o/w NORMAL.  Repeat 02/2017 showed no polyps, +internal hem and sigmoid diverticulosis.. (negative 2008, polyp 2005; Dr Aneita). No repeat recommended due to age.   CORONARY ARTERY BYPASS GRAFT  2003   5 vessel (Dr. Lucas)   FLEXIBLE SIGMOIDOSCOPY     1992, 1997   ganglion cystectomy  hand   HEMORRHOID SURGERY     Dr. Eletha FONTANA HERNIA REPAIR  2019   left and then Right (mesh)   LUMBAR LAMINECTOMY     Dr. Sofie   POLYPECTOMY     TONSILLECTOMY     UMBILICAL HERNIA REPAIR  2018    Social History:  reports that he has never smoked. He has never been exposed to tobacco smoke. He has never used smokeless tobacco. He reports that he does not drink alcohol and does not use drugs.  Allergies:  Allergies  Allergen Reactions   Penicillins     hives    Medications Prior to Admission  Medication Sig Dispense Refill   amLODipine  (NORVASC ) 10 MG tablet Take 1 tablet (10 mg total) by mouth daily. 90 tablet 2   aspirin EC 81 MG tablet Take 81 mg by mouth every evening.     ibuprofen (ADVIL) 200 MG tablet Take 200 mg by mouth every 8 (eight) hours as needed (pain.).      rosuvastatin  (CRESTOR ) 5 MG tablet TAKE ONE TABLET BY MOUTH DAILY FOR CHOLESTEROL. Must keep appointment with Dr. Wonda in December for future refills. 90 tablet 0   valsartan  (DIOVAN ) 320 MG tablet Take 1 tablet (320 mg total) by mouth daily. 90 tablet 0    Physical Exam: Blood pressure (!) 152/71, pulse (!) 55, temperature 97.9 F (36.6 C), temperature source Oral, resp. rate 16, height 6' (1.829 m), weight 80.7 kg, SpO2 97%. Gen: male, NAD Abd: soft, non-distended, right groin marked  No results found for this or any previous visit (from the past 48 hours). No results found.  Assessment/Plan 83 y/o M with a recurrent right inguinal hernia.  - Will proceed to the OR. We discussed the alternatives and potential risks of surgery, including but not limited to: bleeding, infection, damage to bowel or surrounding structures, damage to the vas/cord, mesh complications, chronic pain, recurrent hernia, and need for additional procedures. All questions were addressed and consent was obtained.    Cordella DELENA Polly Marlis [facility] 08/09/2024, 6:58 AM Please see Amion for pager number during day hours 7:00am-4:30pm or 7:00am -11:30am on weekends

## 2024-08-10 ENCOUNTER — Encounter (HOSPITAL_COMMUNITY): Payer: Self-pay | Admitting: General Surgery

## 2024-08-12 ENCOUNTER — Other Ambulatory Visit: Payer: Self-pay | Admitting: Cardiovascular Disease

## 2024-08-12 DIAGNOSIS — I2510 Atherosclerotic heart disease of native coronary artery without angina pectoris: Secondary | ICD-10-CM

## 2024-08-26 ENCOUNTER — Telehealth: Payer: Self-pay

## 2024-08-26 NOTE — Telephone Encounter (Signed)
 Patient wife Ramell Wacha passed away earlier this week. Please see message below  Copied from CRM [id number]. Topic: General - Deceased Patient >> 2024/08/27  1:13 PM Ashley R wrote: Name of caller: Wyett Narine calling to inform office of his wife Ulice Follett passing earlier this week.   Requesting callback from [facility] / Dr Mahlon to let them know as well, and to express his appreciation for their assistance over the years. [phone]

## 2024-08-29 ENCOUNTER — Encounter: Payer: Self-pay | Admitting: Family Medicine

## 2024-10-13 ENCOUNTER — Encounter: Payer: Self-pay | Admitting: Cardiovascular Disease

## 2024-10-13 ENCOUNTER — Ambulatory Visit: Attending: Internal Medicine | Admitting: Cardiovascular Disease

## 2024-10-13 VITALS — BP 130/80 | HR 63 | Ht 72.0 in | Wt 189.0 lb

## 2024-10-13 DIAGNOSIS — I10 Essential (primary) hypertension: Secondary | ICD-10-CM | POA: Insufficient documentation

## 2024-10-13 DIAGNOSIS — Z79899 Other long term (current) drug therapy: Secondary | ICD-10-CM | POA: Diagnosis present

## 2024-10-13 DIAGNOSIS — I2510 Atherosclerotic heart disease of native coronary artery without angina pectoris: Secondary | ICD-10-CM | POA: Insufficient documentation

## 2024-10-13 DIAGNOSIS — E782 Mixed hyperlipidemia: Secondary | ICD-10-CM | POA: Insufficient documentation

## 2024-10-13 LAB — LIPID PANEL
Chol/HDL Ratio: 3 ratio (ref 0.0–5.0)
Cholesterol, Total: 110 mg/dL (ref 100–199)
HDL: 37 mg/dL — ABNORMAL LOW (ref >39)
LDL Chol Calc (NIH): 53 mg/dL (ref 0–99)
Triglycerides: 110 mg/dL (ref 0–149)
VLDL Cholesterol Cal: 20 mg/dL (ref 5–40)

## 2024-10-13 LAB — ALT: ALT: 19 IU/L (ref 0–44)

## 2024-10-13 NOTE — Assessment & Plan Note (Addendum)
 Treated with low-dose rosuvastatin  based on tolerability.  Check lipids and metabolic panel today.

## 2024-10-13 NOTE — Patient Instructions (Signed)
 Medication Instructions:  Your physician recommends that you continue on your current medications as directed. Please refer to the Current Medication list given to you today.  *If you need a refill on your cardiac medications before your next appointment, please call your pharmacy*  Lab Work: Please complete your FASTING lipid panel and a CMET in our first floor lab before you leave today.   If you have labs (blood work) drawn today and your tests are completely normal, you will receive your results only by: MyChart Message (if you have MyChart) OR A paper copy in the mail If you have any lab test that is abnormal or we need to change your treatment, we will call you to review the results.  Testing/Procedures: None.   Follow-Up: At [facility], you and your health needs are our priority.  As part of our continuing mission to provide you with exceptional heart care, our providers are all part of one team.  This team includes your primary Cardiologist (physician) and Advanced Practice Providers or APPs (Physician Assistants and Nurse Practitioners) who all work together to provide you with the care you need, when you need it.  Your next appointment:   1 year(s)  Provider:   Ozell Fell, MD

## 2024-10-13 NOTE — Assessment & Plan Note (Addendum)
 Stable without symptoms of angina.  Continue aspirin and rosuvastatin  at current doses.

## 2024-10-13 NOTE — Progress Notes (Signed)
 Cardiology Office Note:    Date:  10/13/2024   ID:  [name], DOB 06-Jul-1941, MRN [id number]  PCP:  Mahlon Comer BRAVO, MD   [facility] Providers Cardiologist:  Ozell Fell, MD     Referring MD: No ref. provider found   Chief Complaint  Patient presents with   Coronary Artery Disease    History of Present Illness:    Russell [name] is a 83 y.o. male with a hx of CAD, presenting for follow-up evaluation. The patient has coronary artery disease status post remote CABG in 2003 with a LIMA to LAD, saphenous vein graft to diagonal, saphenous vein graft to circumflex OM, and saphenous vein graft to the second and third OM branches. 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   The patient is here alone today. His wife passed away in 09-07-24 after a long struggle with Alzheimer's dementia.  The patient has no cardiac-related complaints today.  He had an inguinal hernia repair over the past year.  He had no perioperative issues.  He denies chest pain, chest pressure, or shortness of breath.  No heart palpitations, lightheadedness, or syncope.   Current Medications: Active Medications[1]   Allergies:   Penicillins   ROS:   Please see the history of present illness.    All other systems reviewed and are negative.  EKGs/Labs/Other Studies Reviewed:    The following studies were reviewed today: Cardiac Studies & Procedures   ______________________________________________________________________________________________   STRESS TESTS  EXERCISE TOLERANCE TEST (ETT) 09/02/2022  Interpretation Summary   A Bruce protocol stress test was performed. Exercise capacity was normal. Patient exercised for 8 min and 0 sec. Maximum HR of 144 bpm. MPHR 102.0 %. Peak METS 15.0 . The patient experienced no angina during the test. Normal blood pressure and normal heart rate response noted during stress. Heart rate recovery was  normal.   No ST deviation was noted. The ECG was negative for ischemia.   This is a low risk study.            ______________________________________________________________________________________________      EKG:   EKG Interpretation Date/Time:  Thursday October 13 2024 09:06:42 EST Ventricular Rate:  63 PR Interval:  208 QRS Duration:  72 QT Interval:  426 QTC Calculation: 435 R Axis:   50  Text Interpretation: Normal sinus rhythm Normal ECG When compared with ECG of 04-Sep-2023 10:47, Nonspecific T wave abnormality no longer evident in Inferior leads Nonspecific T wave abnormality no longer evident in Lateral leads Confirmed by Fell Ozell [phone]) on 10/13/2024 9:14:03 AM    Recent Labs: 07/27/2024: BUN 19; Creatinine, Ser 1.31; Hemoglobin 14.1; Platelets 219; Potassium 3.9; Sodium 139  Recent Lipid Panel    Component Value Date/Time   CHOL 108 09/04/2023 1138   TRIG 92 09/04/2023 1138   HDL 36 (L) 09/04/2023 1138   CHOLHDL 3.0 09/04/2023 1138   CHOLHDL 3 06/21/2020 1305   VLDL 28.8 06/21/2020 1305   LDLCALC 54 09/04/2023 1138     Risk Assessment/Calculations:                Physical Exam:    VS:  BP 130/80 (BP Location: Right Arm, Patient Position: Sitting, Cuff Size: Normal)   Pulse 63   Ht 6' (1.829 m)   Wt 189 lb (85.7 kg)   SpO2 95%   BMI 25.63 kg/m     Wt Readings from Last 3 Encounters:  10/13/24 189 lb (  85.7 kg)  08/09/24 178 lb (80.7 kg)  07/27/24 178 lb (80.7 kg)     GEN:  Well nourished, well developed in no acute distress HEENT: Normal NECK: No JVD; No carotid bruits LYMPHATICS: No lymphadenopathy CARDIAC: RRR, no murmurs, rubs, gallops RESPIRATORY:  Clear to auscultation without rales, wheezing or rhonchi  ABDOMEN: Soft, non-tender, non-distended MUSCULOSKELETAL:  No edema; No deformity  SKIN: Warm and dry NEUROLOGIC:  Alert and oriented x 3 PSYCHIATRIC:  Normal affect   Assessment & Plan Coronary artery disease  involving native coronary artery of native heart without angina pectoris Stable without symptoms of angina.  Continue aspirin and rosuvastatin  at current doses. Essential hypertension Blood pressure controlled on valsartan  and amlodipine .  No changes recommended. Mixed hyperlipidemia Treated with low-dose rosuvastatin  based on tolerability.  Check lipids and metabolic panel today. Medication management No changes are made today.  Blood pressure in lipids appear to be well-controlled on amlodipine , valsartan , and rosuvastatin .  He will remain on aspirin for antiplatelet therapy for his CAD.  I would like to see him back in 1 year for follow-up evaluation.            Medication Adjustments/Labs and Tests Ordered: Current medicines are reviewed at length with the patient today.  Concerns regarding medicines are outlined above.  Orders Placed This Encounter  Procedures   Lipid panel   ALT   EKG 12-Lead   No orders of the defined types were placed in this encounter.   Patient Instructions  Medication Instructions:  Your physician recommends that you continue on your current medications as directed. Please refer to the Current Medication list given to you today.  *If you need a refill on your cardiac medications before your next appointment, please call your pharmacy*  Lab Work: Please complete your FASTING lipid panel and a CMET in our first floor lab before you leave today.   If you have labs (blood work) drawn today and your tests are completely normal, you will receive your results only by: MyChart Message (if you have MyChart) OR A paper copy in the mail If you have any lab test that is abnormal or we need to change your treatment, we will call you to review the results.  Testing/Procedures: None.   Follow-Up: At [facility], you and your health needs are our priority.  As part of our continuing mission to provide you with exceptional heart care, our providers  are all part of one team.  This team includes your primary Cardiologist (physician) and Advanced Practice Providers or APPs (Physician Assistants and Nurse Practitioners) who all work together to provide you with the care you need, when you need it.  Your next appointment:   1 year(s)  Provider:   Ozell Fell, MD              Signed, Ozell Fell, MD  10/13/2024 10:08 AM    [facility]     [1]  Current Meds  Medication Sig   amLODipine  (NORVASC ) 10 MG tablet Take 1 tablet (10 mg total) by mouth daily.   aspirin EC 81 MG tablet Take 81 mg by mouth every evening.   rosuvastatin  (CRESTOR ) 5 MG tablet TAKE ONE TABLET BY MOUTH DAILY FOR CHOLESTEROL. Must keep appointment with Dr. Fell in December for future refills.   valsartan  (DIOVAN ) 320 MG tablet Take 1 tablet (320 mg total) by mouth daily.

## 2024-10-13 NOTE — Assessment & Plan Note (Addendum)
 Blood pressure controlled on valsartan  and amlodipine .  No changes recommended.

## 2024-10-14 ENCOUNTER — Ambulatory Visit: Payer: Self-pay | Admitting: Cardiovascular Disease

## 2024-10-20 ENCOUNTER — Ambulatory Visit: Payer: Self-pay

## 2024-10-20 ENCOUNTER — Telehealth: Admitting: Family Medicine

## 2024-10-20 ENCOUNTER — Other Ambulatory Visit: Payer: Self-pay | Admitting: Cardiovascular Disease

## 2024-10-20 ENCOUNTER — Encounter: Payer: Self-pay | Admitting: Family Medicine

## 2024-10-20 DIAGNOSIS — U071 COVID-19: Secondary | ICD-10-CM | POA: Diagnosis not present

## 2024-10-20 NOTE — Telephone Encounter (Signed)
 FYI Only or Action Required?: FYI only for provider: appointment scheduled on 12.18.25 virtual UC.  Patient was last seen in primary care on 04/15/2024 by Mahlon Comer BRAVO, MD.  Called Nurse Triage reporting Covid Positive.  Symptoms began several days ago.  Interventions attempted: Nothing.  Symptoms are: unchanged.  Triage Disposition: Call PCP Within 24 Hours  Patient/caregiver understands and will follow disposition?: Yes     Copied from CRM #[id number]. Topic: Clinical - Red Word Triage >> Oct 20, 2024  2:47 PM Alfonso ORN wrote: Red Word that prompted transfer to Nurse Triage: pt tested positive for home test, experiencing headache and head congestion, fever yesterday . Requesting medical advice Reason for Disposition  [1] HIGH RISK patient (e.g., weak immune system, 65 years and older, obesity with BMI 30 or higher, pregnant, chronic lung disease) AND [2] COVID symptoms (e.g., cough, fever)  (Exceptions: Already seen by PCP and no new or worsening symptoms.)  Answer Assessment - Initial Assessment Questions Pt states he felt lousy the last 2 days (started Tuesday night) headache, fever, congestion. He found an old covid test and it came back positive. He denies any chest pain or shortness of breath. Given his age recommendations are for him to be evaluated. Pt stated understanding. Pt scheduled for virtual visit. Rn gave covid care instructions.    1. SYMPTOMS: What is your main symptom or concern? (e.g., cough, fever, shortness of breath, muscle aches)     Positive covid test, cough, unsure if fever today 2. ONSET: When did the symptoms start?      Tuesday evening 3. COUGH: Do you have a cough? If Yes, ask: How bad is the cough?       Yes, mild, taking a deep breath induces coughing 4. FEVER: Do you have a fever? If Yes, ask: What is your temperature, how was it measured, and when did it start?     Yes but states not today 5. BREATHING DIFFICULTY: Are you having  any difficulty breathing? (e.g., normal; shortness of breath, wheezing, unable to speak)      denies 6 OTHER SYMPTOMS: Do you have any other symptoms?  (e.g., chills, fatigue, headache, loss of smell or taste, muscle pain, sore throat)     Headache and cough 7. COVID-19 DIAGNOSIS: How do you know that you have COVID? (e.g., positive lab test or self-test, diagnosed by doctor or NP/PA, symptoms after exposure).     Home test 8. COVID-19 EXPOSURE: Was there any known exposure to COVID before the symptoms began?      unknown  Protocols used: COVID-19 - Diagnosed or Suspected-A-AH

## 2024-10-20 NOTE — Progress Notes (Signed)
 Virtual Visit via Video Note  I connected with Russell [name] on 10/20/2024 at  4:30 PM EST by a video enabled telemedicine application and verified that I am speaking with the correct person using two identifiers.  Location patient: home Location provider:work or home office Persons participating in the virtual visit: patient, provider  I discussed the limitations of evaluation and management by telemedicine and the availability of in person appointments. The patient expressed understanding and agreed to proceed. Chief Complaint  Patient presents with   Acute Visit    Patient test positive for COVID about an hour and half ago. Patient has a headache, chest congestion, fever but had conflicting results, achy and fatigue.  Patient took a tylenol .     HPI: Pt is an 83 yo male followed by Dr. Mahlon who was seen for acute concern.   Pt started feeling sick 2 days ago.  Endorses chest congestion, HA, low grade fever 99.53F, feeling achy, decreased appetite, post nasal drainage, rare cough.   Denies diarrhea,  Took tylenol , loratidine.   Home COVID test positive earlier today.  States had COVID a few yrs ago, felt worse then.  ROS: See pertinent positives and negatives per HPI.  Past Medical History:  Diagnosis Date   Adenomatous colon polyp 2005   Arthritis    Basal cell carcinoma (BCC) of forehead    Blood transfusion 2003   Blood transfusion without reported diagnosis 2003   BPH (benign prostatic hypertrophy)    Hx of post-op acute urinary retention 2019.   CAD (coronary artery disease) 2003   CABG 2003.  ETT POSITIVE 09/2018-->10/04/2018 myocard perf imaging showed NO ISCHEMIA, EF 55-65%   Chronic renal insufficiency, stage II (mild)    CrCl 60s   Chronic retention of urine    secondary to severe prostate enlargement and prostate ca--manaed by prostate cryoablation in 04/2018   Elevated PSA    Prostate bx x 2 (BPH only).  As of 08/2017, PSA continues to rise +abnl prostate MRI,  and Dr. 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  Bx done: +prostate cancer cT1c, intermediate risk categ, desires cryoablation as of 02/28/18 urol f/u.   Gout summer 2018   left elbow   Herpes zoster 12/2014   R side of chin (V3 area)   Hyperlipidemia    Hypertension    Myocardial infarction [facility]) unknown   CABG January 2003   Prostate cancer [facility])    Cryoablation 04/2018: doing fine as of urol f/u 06/2018-->plan for annual PSA surveillance by urol   Right inguinal hernia    Repaired 2019   Ulcer 1981   Vitreous detachment of both eyes    + cataracts, not ready for surgery    Past Surgical History:  Procedure Laterality Date   BASAL CELL CARCINOMA EXCISION  04/2011   Mohs , Dr Gabriel   BIOPSY PROSTATE  approx 2010   Dr. Janit.   CARDIOVASCULAR STRESS TEST  08/2014; 08/2015   2015 Low risk: no ischemia.  Inferior/inferolateral scar and slight decreased mobility of LV wall in this region, EF normal.  2016 ETT low risk.   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  09/2018 POSITIVE ETT--cards f/u --> 10/04/2018 Myocard perf imaging showed NO ISCHEMIA, EF 55-65%.   COLONOSCOPY     12/2011 'tics, int hem, o/w NORMAL.  Repeat 02/2017 showed no polyps, +internal  hem and sigmoid diverticulosis.. (negative 2008, polyp 2005; Dr Aneita). No repeat recommended due to age.   CORONARY ARTERY BYPASS GRAFT  2003   5 vessel (Dr. Lucas)   FLEXIBLE SIGMOIDOSCOPY     1992, 1997   ganglion cystectomy      hand   HEMORRHOID SURGERY     Dr. Eletha   HERNIA REPAIR  2004/2019   right/left inguinal   INGUINAL HERNIA REPAIR  2019   left and then Right (mesh)   LUMBAR LAMINECTOMY     Dr. Sofie   POLYPECTOMY     SPINE SURGERY  April 2001   Disectomy   TONSILLECTOMY     UMBILICAL HERNIA REPAIR  2018   XI ROBOTIC ASSISTED INGUINAL HERNIA REPAIR WITH MESH Right  08/09/2024   Procedure: REPAIR, HERNIA, INGUINAL, ROBOT-ASSISTED, LAPAROSCOPIC, USING MESH;  Surgeon: Polly Cordella LABOR, MD;  Location: [facility];  Service: General;  Laterality: Right;  GEN/TAP BLOCK    Family History  Problem Relation Age of Onset   Heart attack Father 24       suicide post MI   Heart disease Father    Heart attack Brother 49   Aneurysm Mother 50       cns; pacer   Colon cancer Neg Hx    Esophageal cancer Neg Hx    Rectal cancer Neg Hx    Stomach cancer Neg Hx    Current Medications[1]  EXAM:  VITALS per patient if applicable:  GENERAL: alert, oriented, appears well and in no acute distress  HEENT: atraumatic, conjunctiva clear, no obvious abnormalities on inspection of external nose and ears  NECK: normal movements of the head and neck  LUNGS: on inspection no signs of respiratory distress, breathing rate appears normal, no obvious gross SOB, gasping or wheezing  CV: no obvious cyanosis  MS: moves all visible extremities without noticeable abnormality  PSYCH/NEURO: pleasant and cooperative, no obvious depression or anxiety, speech and thought processing grossly intact  ASSESSMENT AND PLAN:  Discussed the following assessment and plan:  COVID-19 virus infection Acute viral uri sx with positive home COVID test.  Sx relatively mild.  Discussd r/b/a of antiviral meds.  Pt declines at this time.  Continue supportive care with OTC cough/cold meds, fluids, rest, gargling with warm salt water or Chloraseptic spray, Tylenol , etc.  Given strict precautions for continued or worsening symptoms.   I discussed the assessment and treatment plan with the patient. The patient was provided an opportunity to ask questions and all were answered. The patient agreed with the plan and demonstrated an understanding of the instructions.   The patient was advised to call back or seek an in-person evaluation if the symptoms worsen or if the condition fails to improve as  anticipated.   Clotilda JONELLE Single, MD      [1]  Current Outpatient Medications:    amLODipine  (NORVASC ) 10 MG tablet, Take 1 tablet (10 mg total) by mouth daily., Disp: 90 tablet, Rfl: 0   aspirin EC 81 MG tablet, Take 81 mg by mouth every evening., Disp: , Rfl:    rosuvastatin  (CRESTOR ) 5 MG tablet, TAKE ONE TABLET BY MOUTH DAILY FOR CHOLESTEROL. Must keep appointment with Dr. Wonda in December for future refills., Disp: 90 tablet, Rfl: 0   valsartan  (DIOVAN ) 320 MG tablet, Take 1 tablet (320 mg total) by mouth daily., Disp: 90 tablet, Rfl: 0

## 2024-10-21 ENCOUNTER — Other Ambulatory Visit: Payer: Self-pay | Admitting: Cardiovascular Disease

## 2024-10-21 NOTE — Telephone Encounter (Signed)
 Pt seen at [facility].

## 2024-11-07 DIAGNOSIS — I2510 Atherosclerotic heart disease of native coronary artery without angina pectoris: Secondary | ICD-10-CM
